# Patient Record
Sex: Female | Born: 1995 | Race: Black or African American | Hispanic: No | Marital: Single | Smoking: Never smoker
Health system: Southern US, Community
[De-identification: ages and names within clinical notes are randomized; demographics above are authoritative.]

## PROBLEM LIST (undated history)

## (undated) DIAGNOSIS — F32A Depression, unspecified: Secondary | ICD-10-CM

## (undated) DIAGNOSIS — F329 Major depressive disorder, single episode, unspecified: Secondary | ICD-10-CM

## (undated) HISTORY — PX: WISDOM TOOTH EXTRACTION: SHX21

## (undated) HISTORY — PX: CARDIAC SURGERY: SHX584

## (undated) HISTORY — DX: Major depressive disorder, single episode, unspecified: F32.9

## (undated) HISTORY — DX: Depression, unspecified: F32.A

---
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
## 2019-08-18 ENCOUNTER — Other Ambulatory Visit: Payer: Self-pay

## 2019-08-18 ENCOUNTER — Ambulatory Visit: Payer: Self-pay

## 2019-08-18 ENCOUNTER — Other Ambulatory Visit: Payer: Self-pay | Admitting: Family Medicine

## 2019-08-18 DIAGNOSIS — M25562 Pain in left knee: Secondary | ICD-10-CM

## 2019-12-09 ENCOUNTER — Ambulatory Visit (INDEPENDENT_AMBULATORY_CARE_PROVIDER_SITE_OTHER): Payer: Managed Care, Other (non HMO) | Admitting: Otolaryngology

## 2020-01-09 ENCOUNTER — Ambulatory Visit
Admission: EM | Admit: 2020-01-09 | Discharge: 2020-01-09 | Disposition: A | Discharge status: Home / Self Care | Payer: 59 | Attending: Physician Assistant | Admitting: Physician Assistant

## 2020-01-09 DIAGNOSIS — L500 Allergic urticaria: Secondary | ICD-10-CM

## 2020-01-09 MED ORDER — DEXAMETHASONE SODIUM PHOSPHATE 10 MG/ML IJ SOLN
10.0000 mg | Freq: Once | INTRAMUSCULAR | Status: AC
  Administered 2020-01-09: 10 mg via INTRAMUSCULAR

## 2020-01-09 NOTE — Discharge Instructions (Signed)
Decadron injection in office today. Get over the counter zyrtec 10mg  daily for the next 5-7 days. Monitor for any new changes in foods/contacts. If having trouble breathing, swelling to the throat, go to the emergency department for further evaluation.

## 2020-01-09 NOTE — ED Triage Notes (Signed)
Pt c/o hives to inner thighs, arms, chest, and neck since yesterday. Denies known allergies or new products.

## 2020-01-09 NOTE — ED Provider Notes (Signed)
[facility]    CSN: [id number] Arrival date & time: 01/09/20  1651      History   Chief Complaint Chief Complaint  Patient presents with  . Urticaria    HPI Karen Farmer is a 24 y.o. female.   24 year old female comes in for rash that started yesterday.  States this at first started to BUE, has now spread to the trunk, BLE. Itching in sensation. Denies obvious new contact, oral intake. Denies oral swelling, shortness of breath.      Past Medical History:  Diagnosis Date  . Depression     Patient Active Problem List   Diagnosis Date Noted  . Depression     Past Surgical History:  Procedure Laterality Date  . CARDIAC SURGERY     Age 59 years, stent placed  . WISDOM TOOTH EXTRACTION      OB History   No obstetric history on file.      Home Medications    Prior to Admission medications   Not on File    Family History Family History  Problem Relation Age of Onset  . Kidney disease Mother   . Hypertension Mother   . Hyperlipidemia Mother   . Asthma Mother   . Diabetes Mother   . Diabetes Father     Social History Social History   Tobacco Use  . Smoking status: Never Smoker  . Smokeless tobacco: Never Used  Substance Use Topics  . Alcohol use: No  . Drug use: No     Allergies   Penicillins   Review of Systems Review of Systems  Reason unable to perform ROS: See HPI as above.     Physical Exam Triage Vital Signs ED Triage Vitals  Enc Vitals Group     BP 01/09/20 1915 (!) 146/78     Pulse Rate 01/09/20 1915 66     Resp 01/09/20 1915 18     Temp 01/09/20 1915 98.1 F (36.7 C)     Temp Source 01/09/20 1915 Oral     SpO2 01/09/20 1915 98 %     Weight --      Height --      Head Circumference --      Peak Flow --      Pain Score 01/09/20 1916 0     Pain Loc --      Pain Edu? --      Excl. in GC? --    No data found.  Updated Vital Signs BP (!) 146/78 (BP Location: Left Arm)   Pulse 66   Temp 98.1 F  (36.7 C) (Oral)   Resp 18   LMP 01/02/2020   SpO2 98%   Physical Exam Constitutional:      General: She is not in acute distress.    Appearance: Normal appearance. She is well-developed. She is not toxic-appearing or diaphoretic.  HENT:     Head: Normocephalic and atraumatic.     Mouth/Throat:     Comments: Handling own secretions well. Eyes:     Conjunctiva/sclera: Conjunctivae normal.     Pupils: Pupils are equal, round, and reactive to light.  Pulmonary:     Effort: Pulmonary effort is normal. No respiratory distress.  Musculoskeletal:     Cervical back: Normal range of motion and neck supple.  Skin:    General: Skin is warm and dry.     Comments: Urticarial rash to BUE, trunk.   Neurological:     Mental Status: She is  alert and oriented to person, place, and time.      UC Treatments / Results  Labs (all labs ordered are listed, but only abnormal results are displayed) Labs Reviewed - No data to display  EKG   Radiology No results found.  Procedures Procedures (including critical care time)  Medications Ordered in UC Medications  dexamethasone (DECADRON) injection 10 mg (10 mg Intramuscular Given 01/09/20 1933)    Initial Impression / Assessment and Plan / UC Course  I have reviewed the triage vital signs and the nursing notes.  Pertinent labs & imaging results that were available during my care of the patient were reviewed by me and considered in my medical decision making (see chart for details).    Decadron injection in office today. Continue antihistamine. Continue to monitor for possible exposures. Return precautions given.  Final Clinical Impressions(s) / UC Diagnoses   Final diagnoses:  Allergic urticaria   ED Prescriptions    None     PDMP not reviewed this encounter.   Belinda Fisher, PA-C 01/09/20 [phone]

## 2020-05-06 ENCOUNTER — Emergency Department (HOSPITAL_BASED_OUTPATIENT_CLINIC_OR_DEPARTMENT_OTHER): Payer: 59

## 2020-05-06 ENCOUNTER — Emergency Department (HOSPITAL_BASED_OUTPATIENT_CLINIC_OR_DEPARTMENT_OTHER)
Admission: EM | Admit: 2020-05-06 | Discharge: 2020-05-06 | Disposition: A | Discharge status: Home / Self Care | Payer: 59 | Attending: Emergency Medicine | Admitting: Emergency Medicine

## 2020-05-06 ENCOUNTER — Other Ambulatory Visit: Payer: Self-pay

## 2020-05-06 DIAGNOSIS — U071 COVID-19: Secondary | ICD-10-CM | POA: Insufficient documentation

## 2020-05-06 DIAGNOSIS — Z955 Presence of coronary angioplasty implant and graft: Secondary | ICD-10-CM | POA: Diagnosis not present

## 2020-05-06 DIAGNOSIS — R0602 Shortness of breath: Secondary | ICD-10-CM | POA: Diagnosis present

## 2020-05-06 NOTE — ED Triage Notes (Signed)
Pt. Tested positive for Covid on Thurs 17th .  Pt. Said she was feeling short of breath worse today.  Sat in triage is 100% with no SHOB  Noted.  Pt. Is clearing her throat in triage constantly.

## 2020-05-06 NOTE — ED Provider Notes (Signed)
[facility] EMERGENCY DEPARTMENT Provider Note   CSN: [id number] Arrival date & time: 05/06/20  1932     History Chief Complaint  Patient presents with  . Shortness of Breath    Karen Farmer is a 25 y.o. female.  The history is provided by the patient.  Shortness of Breath Severity:  Moderate Onset quality:  Gradual Duration:  2 days Timing:  Intermittent Progression:  Worsening Chronicity:  New Context comment:  Diagnosed with COVID 9 days ago Relieved by:  Rest and sitting up Worsened by:  Activity (laying down) Ineffective treatments:  None tried Associated symptoms: cough and headaches   Associated symptoms: no abdominal pain, no chest pain, no fever, no sputum production and no wheezing   Risk factors comment:  Healthy      Past Medical History:  Diagnosis Date  . Depression     Patient Active Problem List   Diagnosis Date Noted  . Depression     Past Surgical History:  Procedure Laterality Date  . CARDIAC SURGERY     Age 25 years, stent placed  . WISDOM TOOTH EXTRACTION       OB History   No obstetric history on file.     Family History  Problem Relation Age of Onset  . Kidney disease Mother   . Hypertension Mother   . Hyperlipidemia Mother   . Asthma Mother   . Diabetes Mother   . Diabetes Father     Social History   Tobacco Use  . Smoking status: Never Smoker  . Smokeless tobacco: Never Used  Substance Use Topics  . Alcohol use: No  . Drug use: No    Home Medications Prior to Admission medications   Not on File    Allergies    Penicillins  Review of Systems   Review of Systems  Constitutional: Negative for fever.  Respiratory: Positive for cough and shortness of breath. Negative for sputum production and wheezing.   Cardiovascular: Negative for chest pain.  Gastrointestinal: Negative for abdominal pain.  Neurological: Positive for headaches.  All other systems reviewed and are negative.   Physical  Exam Updated Vital Signs BP 120/80 (BP Location: Right Arm)   Pulse 88   Temp 98.5 F (36.9 C) (Oral)   Resp 18   Ht 5\' 3"  (1.6 m)   Wt 81.2 kg   LMP 04/12/2020   SpO2 100%   BMI 31.71 kg/m   Physical Exam Vitals and nursing note reviewed.  Constitutional:      General: She is not in acute distress.    Appearance: She is well-developed and well-nourished.  HENT:     Head: Normocephalic and atraumatic.  Eyes:     Extraocular Movements: EOM normal.     Pupils: Pupils are equal, round, and reactive to light.  Cardiovascular:     Rate and Rhythm: Normal rate and regular rhythm.     Pulses: Intact distal pulses.     Heart sounds: Normal heart sounds. No murmur heard. No friction rub.  Pulmonary:     Effort: Pulmonary effort is normal. No tachypnea or accessory muscle usage.     Breath sounds: Normal breath sounds. No decreased breath sounds, wheezing or rales.  Musculoskeletal:        General: No tenderness. Normal range of motion.     Right lower leg: No edema.     Left lower leg: No edema.     Comments: No edema  Skin:    General:  Skin is warm and dry.     Findings: No rash.  Neurological:     General: No focal deficit present.     Mental Status: She is alert and oriented to person, place, and time. Mental status is at baseline.     Cranial Nerves: No cranial nerve deficit.  Psychiatric:        Mood and Affect: Mood and affect and mood normal.        Behavior: Behavior normal.     ED Results / Procedures / Treatments   Labs (all labs ordered are listed, but only abnormal results are displayed) Labs Reviewed - No data to display  EKG None  Radiology DG [facility] 1 View  Result Date: 05/06/2020 CLINICAL DATA:  Shortness of breath.  Body aches. EXAM: PORTABLE CHEST 1 VIEW COMPARISON:  None. FINDINGS: The heart size and mediastinal contours are within normal limits. Both lungs are clear. The visualized skeletal structures are unremarkable. IMPRESSION: No active  disease. Electronically Signed   By: Gerome Sam III M.D   On: 05/06/2020 20:23    Procedures Procedures   Medications Ordered in ED Medications - No data to display  ED Course  I have reviewed the triage vital signs and the nursing notes.  Pertinent labs & imaging results that were available during my care of the patient were reviewed by me and considered in my medical decision making (see chart for details).    MDM Rules/Calculators/A&P                          Pt with symptoms consistent with COVID.  Well appearing here.  No signs of breathing difficulty and sats 100% on RA.  Pt is vaccinated and tested positive about 8-9 days ago.  She was doing ok until yesterday when she started having SOB when lying down and extreme fatigue and SOB with activity.  No signs of pharyngitis, otitis or abnormal abdominal findings.   CXR pending.  8:29 PM CXR wnl.  Low suspicion for PE at this time as patient has no tachycardia or chest pain.  Normal sats and suspect normal progression of covid.  Will d/c home to continue supportive care.  TRELLIS GUIRGUIS was evaluated in Emergency Department on 05/06/2020 for the symptoms described in the history of present illness. She was evaluated in the context of the global COVID-19 pandemic, which necessitated consideration that the patient might be at risk for infection with the SARS-CoV-2 virus that causes COVID-19. Institutional protocols and algorithms that pertain to the evaluation of patients at risk for COVID-19 are in a state of rapid change based on information released by regulatory bodies including the CDC and federal and state organizations. These policies and algorithms were followed during the patient's care in the ED.   Final Clinical Impression(s) / ED Diagnoses Final diagnoses:  COVID    Rx / DC Orders ED Discharge Orders    None       Gwyneth Sprout, MD 05/06/20 2038

## 2020-05-06 NOTE — ED Notes (Signed)
EDP at bedside  

## 2020-05-14 ENCOUNTER — Other Ambulatory Visit: Payer: Self-pay

## 2020-05-14 ENCOUNTER — Ambulatory Visit
Admission: EM | Admit: 2020-05-14 | Discharge: 2020-05-14 | Disposition: A | Discharge status: Home / Self Care | Payer: 59 | Attending: Emergency Medicine | Admitting: Emergency Medicine

## 2020-05-14 ENCOUNTER — Encounter: Payer: Self-pay | Admitting: *Deleted

## 2020-05-14 DIAGNOSIS — M546 Pain in thoracic spine: Secondary | ICD-10-CM | POA: Diagnosis not present

## 2020-05-14 DIAGNOSIS — S8002XA Contusion of left knee, initial encounter: Secondary | ICD-10-CM

## 2020-05-14 MED ORDER — IBUPROFEN 800 MG PO TABS: 800.0000 mg | ORAL_TABLET | Freq: Three times a day (TID) | ORAL | 0 refills | Status: AC

## 2020-05-14 MED ORDER — CYCLOBENZAPRINE HCL 5 MG PO TABS: 5.0000 mg | ORAL_TABLET | Freq: Two times a day (BID) | ORAL | 0 refills | Status: AC | PRN
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
## 2020-05-14 NOTE — Discharge Instructions (Signed)
Use anti-inflammatories for pain/swelling. You may take up to 800 mg Ibuprofen every 8 hours with food. You may supplement Ibuprofen with Tylenol [phone] mg every 8 hours.   You may use flexeril as needed to help with pain. This is a muscle relaxer and causes sedation- please use only at bedtime or when you will be home and not have to drive/work  Ice knee, alternate ice and heat to back  Follow up if not improving or worsening
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
## 2020-09-19 ENCOUNTER — Encounter: Payer: Self-pay | Admitting: Emergency Medicine

## 2020-09-19 ENCOUNTER — Other Ambulatory Visit: Payer: Self-pay

## 2020-09-19 ENCOUNTER — Ambulatory Visit
Admission: EM | Admit: 2020-09-19 | Discharge: 2020-09-19 | Disposition: A | Discharge status: Home / Self Care | Payer: 59 | Attending: Family Medicine | Admitting: Family Medicine

## 2020-09-19 DIAGNOSIS — Z113 Encounter for screening for infections with a predominantly sexual mode of transmission: Secondary | ICD-10-CM | POA: Diagnosis not present

## 2020-09-19 DIAGNOSIS — Z880 Allergy status to penicillin: Secondary | ICD-10-CM | POA: Diagnosis not present

## 2020-09-19 DIAGNOSIS — N760 Acute vaginitis: Secondary | ICD-10-CM | POA: Insufficient documentation

## 2020-09-19 DIAGNOSIS — N898 Other specified noninflammatory disorders of vagina: Secondary | ICD-10-CM | POA: Diagnosis present

## 2020-09-19 MED ORDER — FLUCONAZOLE 150 MG PO TABS: 150.0000 mg | ORAL_TABLET | ORAL | 0 refills | Status: AC

## 2020-09-19 NOTE — ED Provider Notes (Signed)
[facility]    CSN: [id number] Arrival date & time: 09/19/20  1359      History   Chief Complaint Chief Complaint  Patient presents with   Vaginal Discharge    HPI Karen Farmer is a 25 y.o. female.   Patient presenting today with 2 to 3-day history of vaginal discharge that she states is clumpy and white, vaginal itching and irritation.  She denies any new sexual partners, new soaps or washes, medication changes.  Has not tried anything over-the-counter for symptoms.  No past history that she is aware of of vaginal infections.  Started her menstrual cycle yesterday.   Past Medical History:  Diagnosis Date   Depression     Patient Active Problem List   Diagnosis Date Noted   Depression     Past Surgical History:  Procedure Laterality Date   CARDIAC SURGERY     Age 32 years, stent placed   WISDOM TOOTH EXTRACTION      OB History   No obstetric history on file.      Home Medications    Prior to Admission medications   Medication Sig Start Date End Date Taking? Authorizing Provider  fluconazole (DIFLUCAN) 150 MG tablet Take 1 tablet (150 mg total) by mouth every other day. 09/19/20  Yes Particia Nearing, PA-C  cyclobenzaprine (FLEXERIL) 5 MG tablet Take 1-2 tablets (5-10 mg total) by mouth 2 (two) times daily as needed for muscle spasms. 05/14/20   Wieters, Hallie C, PA-C  ibuprofen (ADVIL) 800 MG tablet Take 1 tablet (800 mg total) by mouth 3 (three) times daily. 05/14/20   Wieters, Junius Creamer, PA-C    Family History Family History  Problem Relation Age of Onset   Kidney disease Mother    Hypertension Mother    Hyperlipidemia Mother    Asthma Mother    Diabetes Mother    Diabetes Father     Social History Social History   Tobacco Use   Smoking status: Never   Smokeless tobacco: Never  Substance Use Topics   Alcohol use: No   Drug use: No     Allergies   Penicillins   Review of Systems Review of Systems Per HPI  Physical  Exam Triage Vital Signs ED Triage Vitals  Enc Vitals Group     BP 09/19/20 1445 108/73     Pulse Rate 09/19/20 1445 71     Resp 09/19/20 1445 13     Temp 09/19/20 1445 98.7 F (37.1 C)     Temp src --      SpO2 09/19/20 1445 98 %     Weight --      Height --      Head Circumference --      Peak Flow --      Pain Score 09/19/20 1444 0     Pain Loc --      Pain Edu? --      Excl. in GC? --    No data found.  Updated Vital Signs BP 108/73 (BP Location: Left Arm)   Pulse 71   Temp 98.7 F (37.1 C)   Resp 13   LMP 09/18/2020 (Exact Date)   SpO2 98%   Visual Acuity Right Eye Distance:   Left Eye Distance:   Bilateral Distance:    Right Eye Near:   Left Eye Near:    Bilateral Near:     Physical Exam Vitals and nursing note reviewed.  Constitutional:  Appearance: Normal appearance. She is not ill-appearing.  HENT:     Head: Atraumatic.  Eyes:     Extraocular Movements: Extraocular movements intact.     Conjunctiva/sclera: Conjunctivae normal.  Cardiovascular:     Rate and Rhythm: Normal rate and regular rhythm.     Heart sounds: Normal heart sounds.  Pulmonary:     Effort: Pulmonary effort is normal.     Breath sounds: Normal breath sounds.  Abdominal:     General: Bowel sounds are normal. There is no distension.     Palpations: Abdomen is soft.     Tenderness: There is no abdominal tenderness. There is no guarding.  Genitourinary:    Comments: GU exam deferred, self swab performed Musculoskeletal:        General: Normal range of motion.     Cervical back: Normal range of motion and neck supple.  Skin:    General: Skin is warm and dry.  Neurological:     Mental Status: She is alert and oriented to person, place, and time.  Psychiatric:        Mood and Affect: Mood normal.        Thought Content: Thought content normal.        Judgment: Judgment normal.     UC Treatments / Results  Labs (all labs ordered are listed, but only abnormal results are  displayed) Labs Reviewed  CERVICOVAGINAL ANCILLARY ONLY    EKG   Radiology No results found.  Procedures Procedures (including critical care time)  Medications Ordered in UC Medications - No data to display  Initial Impression / Assessment and Plan / UC Course  I have reviewed the triage vital signs and the nursing notes.  Pertinent labs & imaging results that were available during my care of the patient were reviewed by me and considered in my medical decision making (see chart for details).     Given symptoms, suspect vaginal yeast infection but awaiting vaginal swab for further evaluation.  In the meantime, will treat with Diflucan tab, probiotics, unscented soaps and avoiding any sort of feminine washes.  Adjust as needed based on results.  Final Clinical Impressions(s) / UC Diagnoses   Final diagnoses:  Acute vaginitis   Discharge Instructions   None    ED Prescriptions     Medication Sig Dispense Auth. Provider   fluconazole (DIFLUCAN) 150 MG tablet Take 1 tablet (150 mg total) by mouth every other day. 2 tablet Particia Nearing, [place]      PDMP not reviewed this encounter.   Particia Nearing, [place] 09/19/20 [phone]

## 2020-09-19 NOTE — ED Triage Notes (Signed)
Patient c/o vaginal discharge x 2 days.   Patient states " my discharge looks like cottage cheese and has an odor".   Patient endorses vaginal irritation and itching.   Patient denies ABD pain, dysuria, and N/V/D.   Patient has tried cold compress with no relief of symptoms.

## 2020-09-20 ENCOUNTER — Telehealth (HOSPITAL_COMMUNITY): Payer: Self-pay | Admitting: Emergency Medicine

## 2020-09-20 LAB — CERVICOVAGINAL ANCILLARY ONLY
Bacterial Vaginitis (gardnerella): POSITIVE — AB
Candida Glabrata: NEGATIVE
Candida Vaginitis: POSITIVE — AB
Chlamydia: POSITIVE — AB
Comment: NEGATIVE
Comment: NEGATIVE
Comment: NEGATIVE
Comment: NEGATIVE
Comment: NEGATIVE
Comment: NORMAL
Neisseria Gonorrhea: POSITIVE — AB
Trichomonas: NEGATIVE

## 2020-09-20 MED ORDER — DOXYCYCLINE HYCLATE 100 MG PO CAPS: 100.0000 mg | ORAL_CAPSULE | Freq: Two times a day (BID) | ORAL | 0 refills | Status: AC

## 2020-09-20 MED ORDER — METRONIDAZOLE 0.75 % VA GEL: 1.0000 | Freq: Every day | VAGINAL | 0 refills | Status: AC

## 2020-09-20 NOTE — Telephone Encounter (Signed)
Per protocol, patient will need treatment with IM Rocephin 500mg  for positive gonorrhea.  All other results and treatment reviewed. Contacted patient by phone.  Verified identity using two identifiers.  Provided positive result.  Reviewed safe sex practices, notifying partners, and refraining from sexual activities for 7 days from time of treatment.  Patient verified understanding, all questions answered.

## 2020-09-21 ENCOUNTER — Other Ambulatory Visit: Payer: Self-pay

## 2020-09-21 ENCOUNTER — Ambulatory Visit
Admission: EM | Admit: 2020-09-21 | Discharge: 2020-09-21 | Disposition: A | Discharge status: Home / Self Care | Payer: 59 | Attending: Emergency Medicine | Admitting: Emergency Medicine

## 2020-09-21 DIAGNOSIS — A549 Gonococcal infection, unspecified: Secondary | ICD-10-CM

## 2020-09-21 MED ORDER — CEFTRIAXONE SODIUM 500 MG IJ SOLR
500.0000 mg | Freq: Once | INTRAMUSCULAR | Status: AC
Start: 2020-09-21 — End: 2020-09-21
  Administered 2020-09-21: 500 mg via INTRAMUSCULAR

## 2020-09-21 NOTE — ED Triage Notes (Signed)
Pt presents for STD tx. Per provider's request verified patient's quality of allergic reaction to penicillin. Pt confirms previous rash with penicillin. Denies sob.

## 2021-11-05 IMAGING — DX DG KNEE COMPLETE 4+V*L*
4 series · 4 of 4 positions shown · non-contrast
Comparison: None.

CLINICAL DATA: Pain after a fall.

EXAM:
LEFT KNEE - COMPLETE 4+ VIEW

[knee pa]
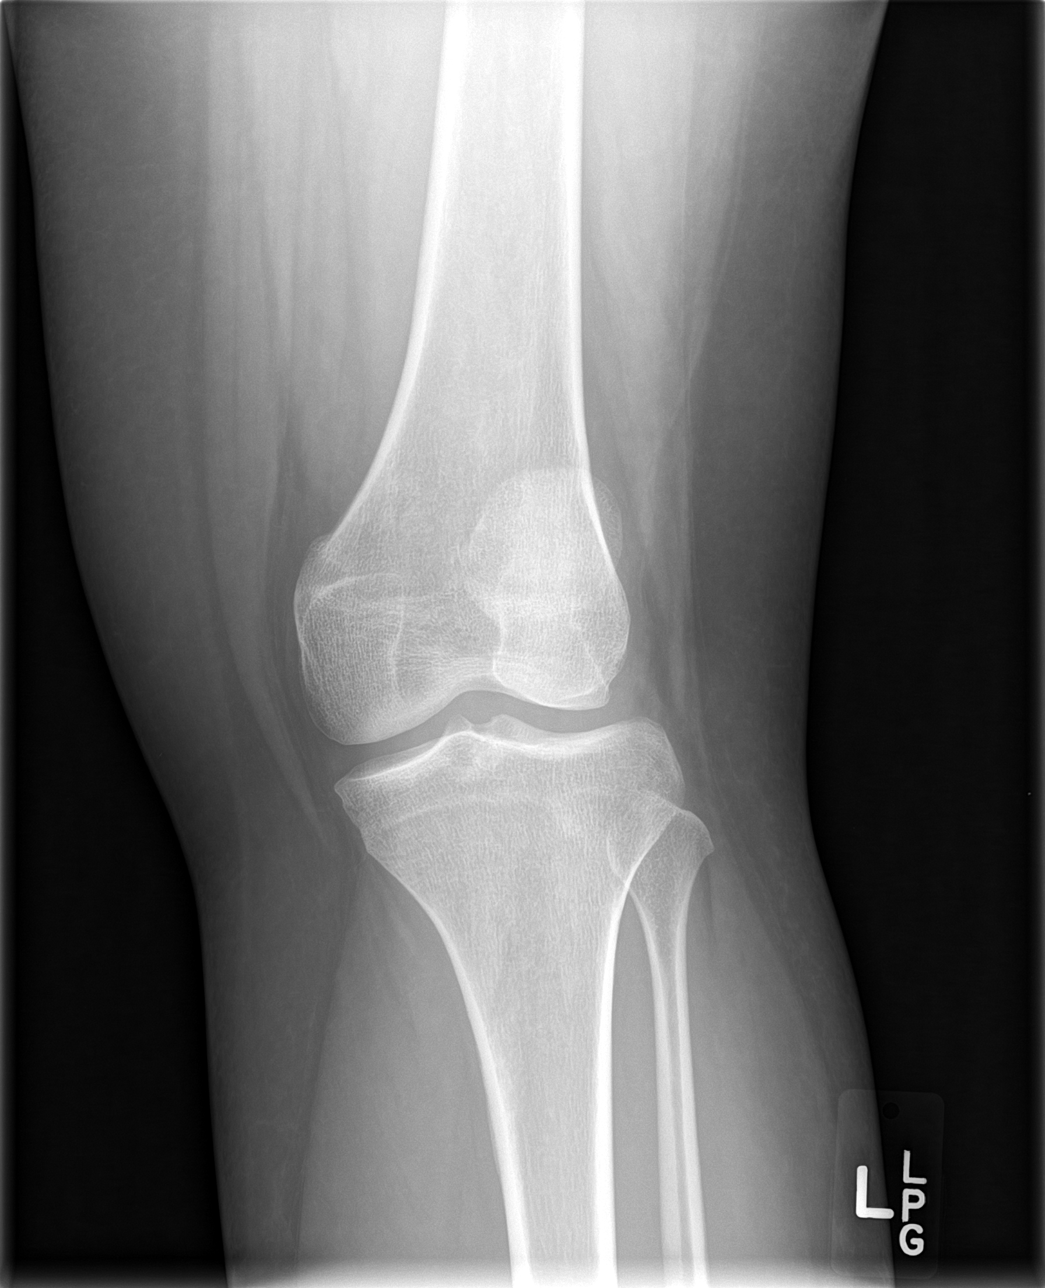

[knee obl (1 of 2)]
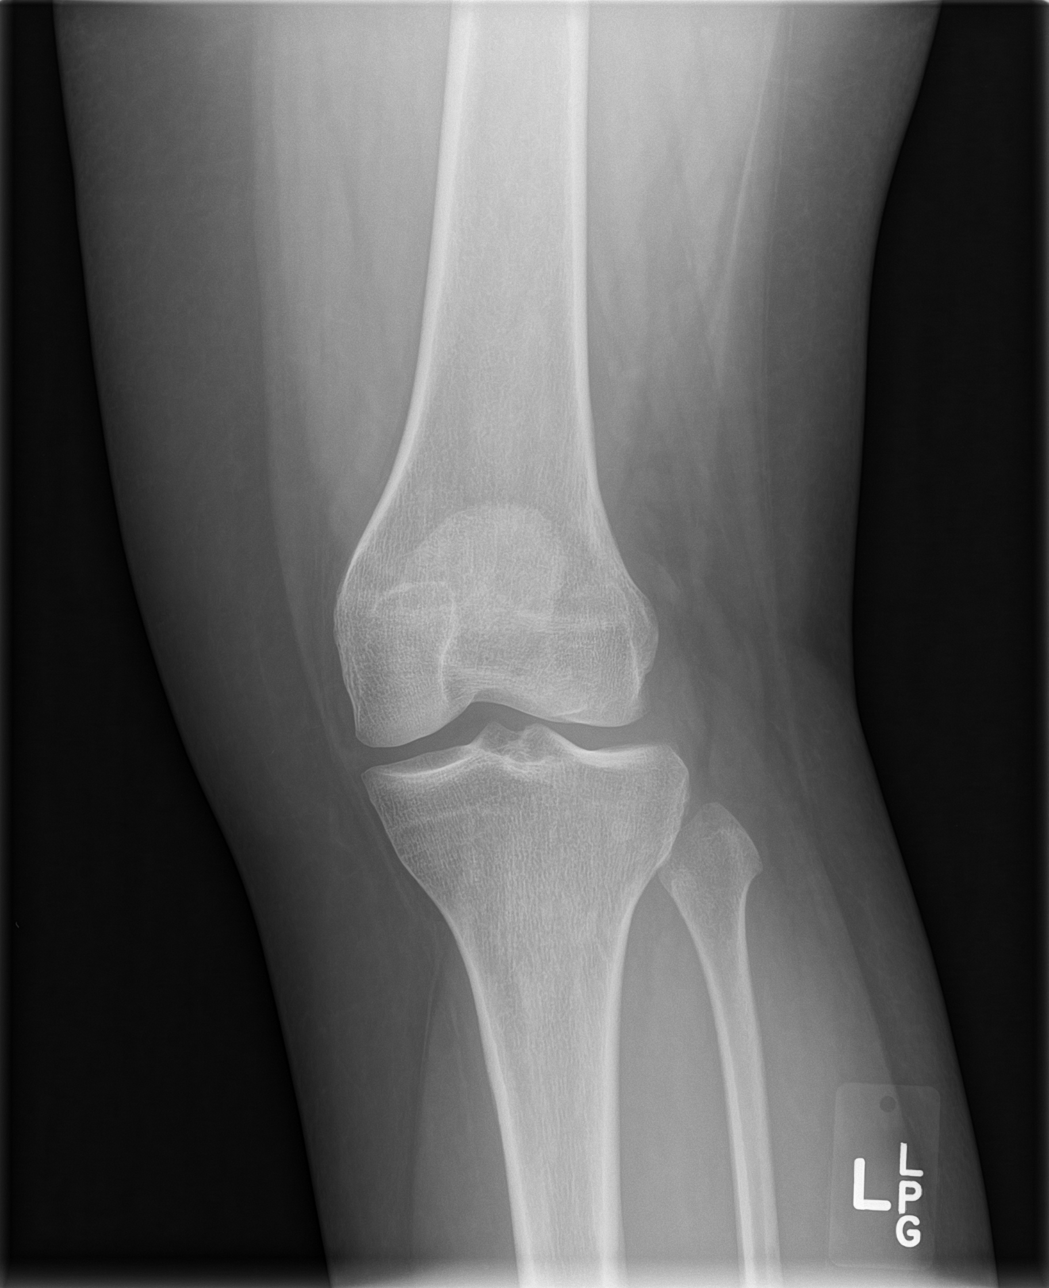

[knee obl (2 of 2)]
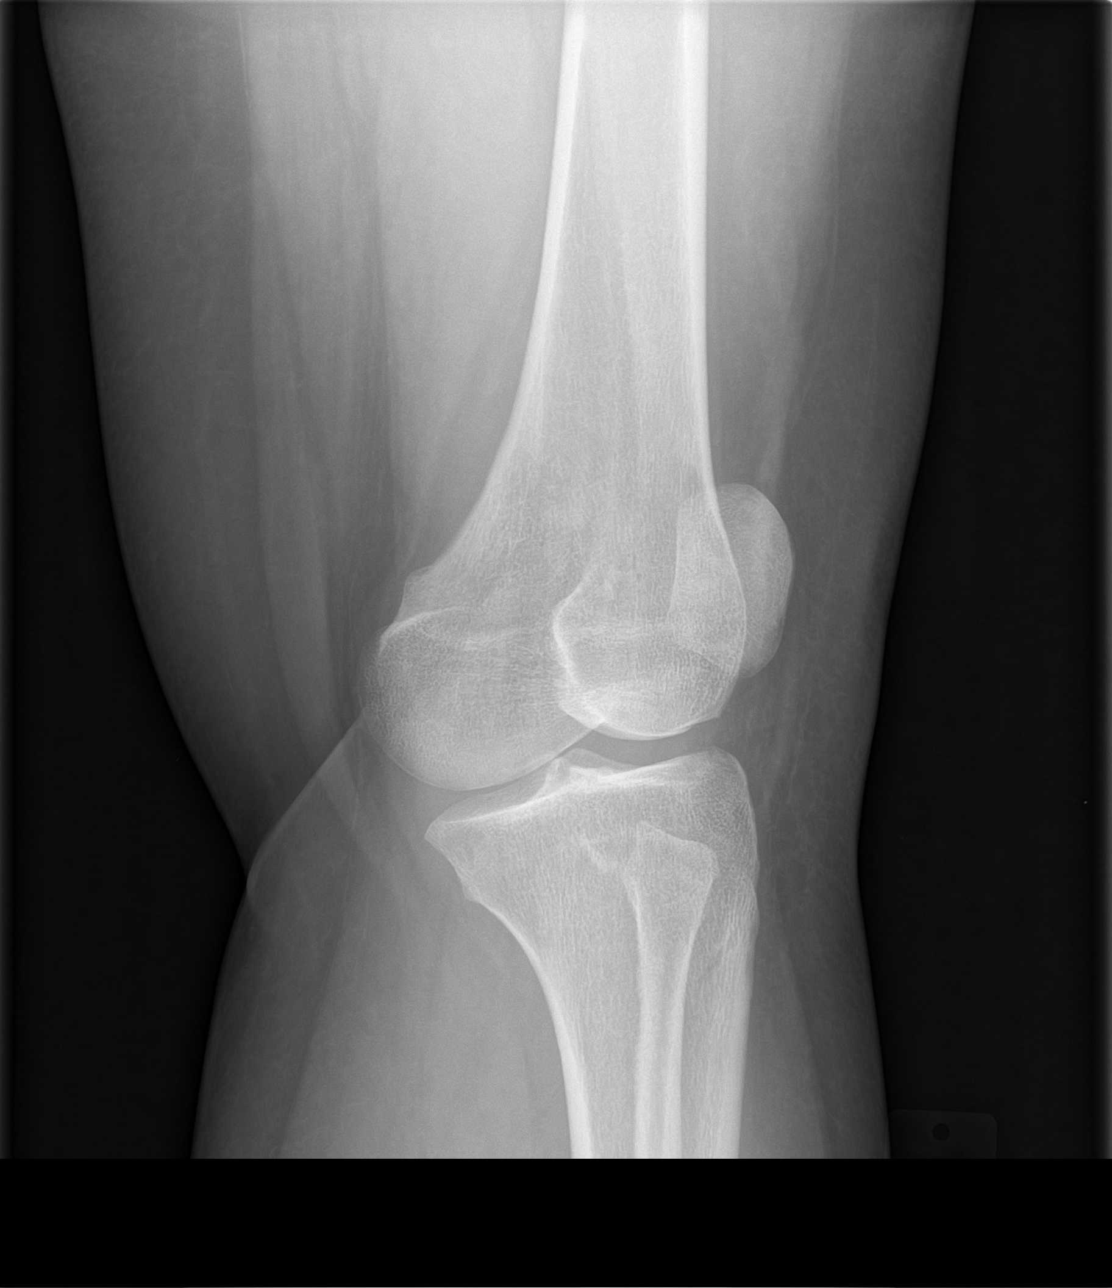

[knee lat]
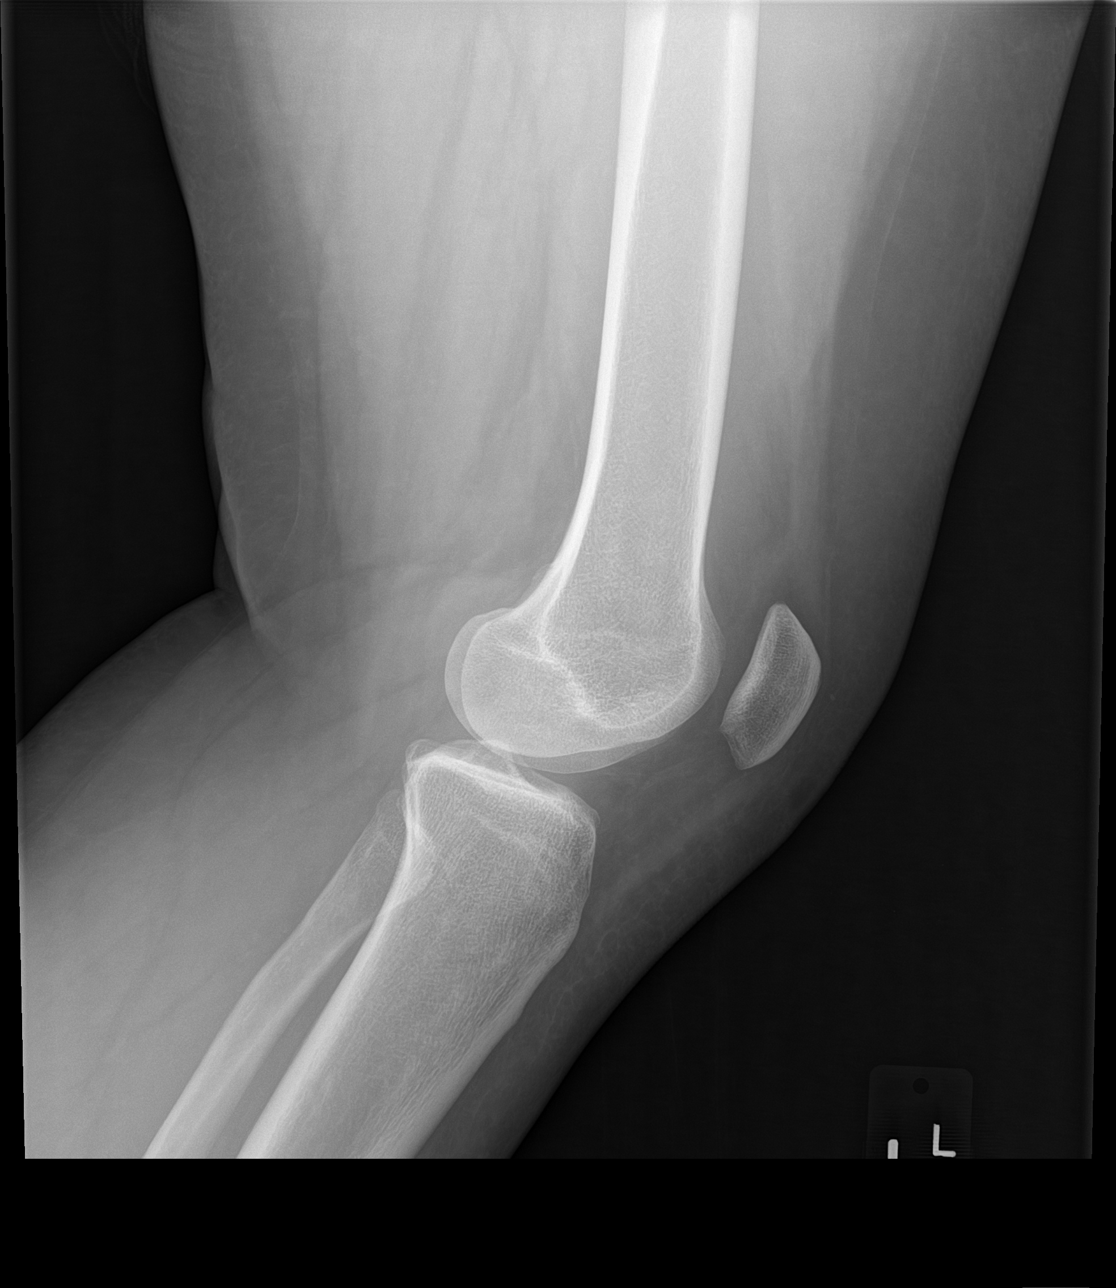

[4 of 4 positions shown; findings below may reference images not displayed]

FINDINGS: No acute fracture or dislocation. Joint spaces maintained. No joint
effusion.
IMPRESSION: No acute osseous abnormality.

## 2021-11-09 ENCOUNTER — Other Ambulatory Visit: Payer: Self-pay

## 2021-11-09 ENCOUNTER — Ambulatory Visit (INDEPENDENT_AMBULATORY_CARE_PROVIDER_SITE_OTHER): Payer: 59

## 2021-11-09 ENCOUNTER — Encounter (HOSPITAL_COMMUNITY): Payer: Self-pay | Admitting: Emergency Medicine

## 2021-11-09 ENCOUNTER — Ambulatory Visit (HOSPITAL_COMMUNITY)
Admission: EM | Admit: 2021-11-09 | Discharge: 2021-11-09 | Disposition: A | Discharge status: Home / Self Care | Payer: 59

## 2021-11-09 DIAGNOSIS — M79661 Pain in right lower leg: Secondary | ICD-10-CM

## 2021-11-09 DIAGNOSIS — S80811A Abrasion, right lower leg, initial encounter: Secondary | ICD-10-CM | POA: Diagnosis not present

## 2021-11-09 DIAGNOSIS — M79604 Pain in right leg: Secondary | ICD-10-CM

## 2021-11-09 MED ORDER — IBUPROFEN 800 MG PO TABS
800.0000 mg | ORAL_TABLET | Freq: Once | ORAL | Status: AC
  Administered 2021-11-09: 800 mg via ORAL

## 2021-11-09 MED ORDER — IBUPROFEN 800 MG PO TABS
ORAL_TABLET | ORAL | Status: AC
  Filled 2021-11-09: qty 1
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
## 2021-11-09 NOTE — Discharge Instructions (Addendum)
I recommend using ibuprofen every 6 hours for pain. You can also alternate with tylenol. Try ice to the areas of pain/swelling, elevate the leg. You can apply antibiotic ointment twice daily to the cuts on your leg.  If you have any worsening symptoms including severe headache, loss of consciousness, vomiting, weakness or loss of sensation in the extremities, etc, please go right to the emergency department.
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
## 2021-11-15 ENCOUNTER — Encounter: Payer: Self-pay | Admitting: Sports Medicine

## 2021-11-15 ENCOUNTER — Ambulatory Visit (INDEPENDENT_AMBULATORY_CARE_PROVIDER_SITE_OTHER): Payer: 59 | Admitting: Sports Medicine

## 2021-11-15 DIAGNOSIS — T300 Burn of unspecified body region, unspecified degree: Secondary | ICD-10-CM | POA: Diagnosis not present
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
## 2022-07-25 IMAGING — DX DG CHEST 1V PORT
1 series · 1 of 1 positions shown · non-contrast
Comparison: None.

CLINICAL DATA: Shortness of breath.  Body aches.

EXAM:
PORTABLE CHEST 1 VIEW

[chest ap]
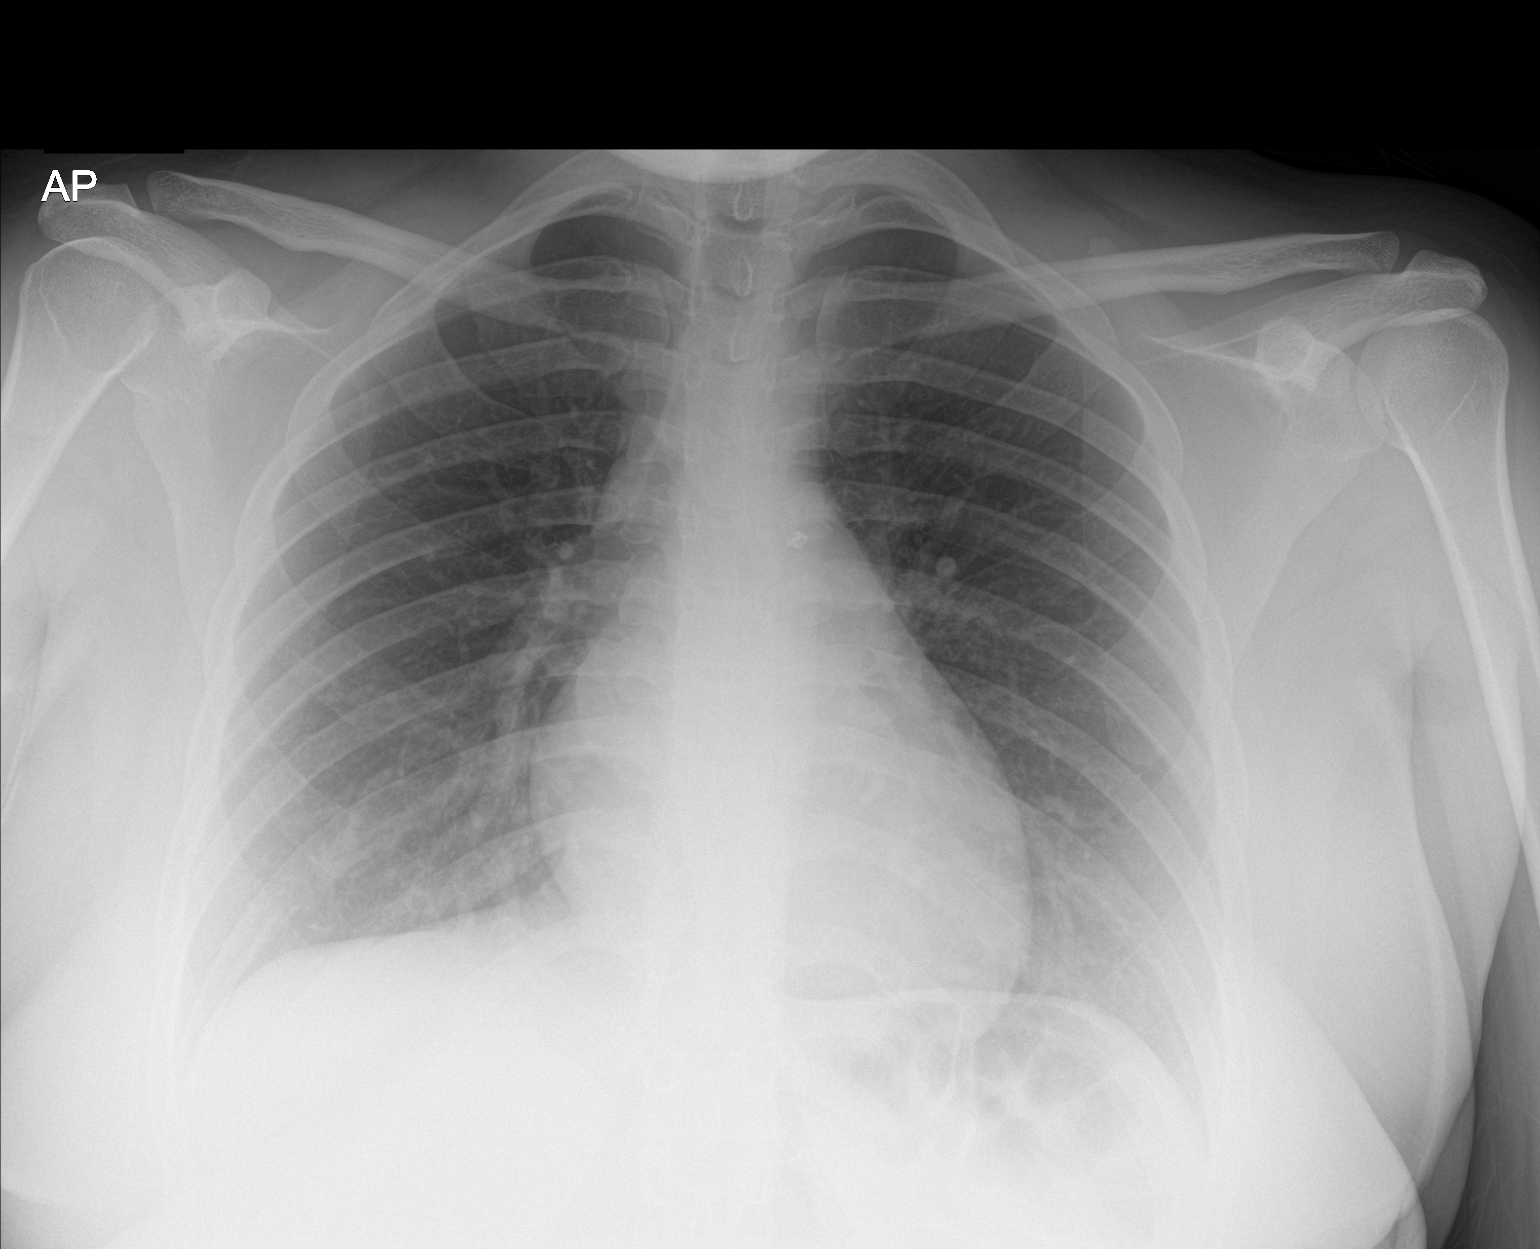

[1 of 1 positions shown; findings below may reference images not displayed]

FINDINGS: The heart size and mediastinal contours are within normal limits.
Both lungs are clear. The visualized skeletal structures are
unremarkable.
IMPRESSION: No active disease.

## 2023-04-08 ENCOUNTER — Ambulatory Visit
Admission: RE | Admit: 2023-04-08 | Discharge: 2023-04-08 | Disposition: A | Discharge status: Home / Self Care | Payer: 59 | Source: Ambulatory Visit | Attending: Internal Medicine | Admitting: Internal Medicine

## 2023-04-08 VITALS — BP 121/72 | HR 86 | Temp 98.8°F | Resp 16

## 2023-04-08 DIAGNOSIS — E119 Type 2 diabetes mellitus without complications: Secondary | ICD-10-CM | POA: Diagnosis not present

## 2023-04-08 DIAGNOSIS — N898 Other specified noninflammatory disorders of vagina: Secondary | ICD-10-CM | POA: Insufficient documentation

## 2023-04-08 DIAGNOSIS — R300 Dysuria: Secondary | ICD-10-CM | POA: Diagnosis not present

## 2023-04-08 DIAGNOSIS — Z113 Encounter for screening for infections with a predominantly sexual mode of transmission: Secondary | ICD-10-CM | POA: Diagnosis not present

## 2023-04-08 DIAGNOSIS — J101 Influenza due to other identified influenza virus with other respiratory manifestations: Secondary | ICD-10-CM | POA: Insufficient documentation

## 2023-04-08 LAB — POCT URINALYSIS DIP (MANUAL ENTRY)
Bilirubin, UA: NEGATIVE
Glucose, UA: NEGATIVE mg/dL
Nitrite, UA: NEGATIVE
Protein Ur, POC: 30 mg/dL — AB
Spec Grav, UA: >=1.03 — AB (ref 1.010–1.025)
Urobilinogen, UA: 0.2 U/dL
pH, UA: 6 (ref 5.0–8.0)

## 2023-04-08 LAB — POCT INFLUENZA A/B
Influenza A, POC: POSITIVE — AB
Influenza B, POC: NEGATIVE

## 2023-04-08 MED ORDER — OSELTAMIVIR PHOSPHATE 75 MG PO CAPS: 75.0000 mg | ORAL_CAPSULE | Freq: Two times a day (BID) | ORAL | 0 refills | Status: AC

## 2023-04-08 MED ORDER — PROMETHAZINE-DM 6.25-15 MG/5ML PO SYRP: 5.0000 mL | ORAL_SOLUTION | Freq: Every evening | ORAL | 0 refills | Status: AC | PRN

## 2023-04-08 NOTE — ED Provider Notes (Signed)
[facility]    CSN: [id number] Arrival date & time: 04/08/23  1032      History   Chief Complaint Chief Complaint  Patient presents with   Cough    Entered by patient    HPI Karen Farmer is a 28 y.o. female.   [name] is a 28 y.o. female presenting for chief complaint of cough, nasal congestion, sore throat, and generalized fatigue that started 2 days ago on Monday April 06, 2023. Cough is productive. Max temp at home 102 yesterday. Coworkers have been sick with similar symptoms. Denies N/V/D, abdominal pain, CP, SOB, rash, and dizziness. Never smoker, no history of chronic respiratory problems. Taking OTC cough and cold medicine with some relief.   Additionally reports white thin vaginal discharge and vaginal itching started a few days ago. Denies vaginal odor. She was treated for BV with Flagyl Gel intravaginally for 5 days while she was on her menstrual cycle and she states she "feels like it didn't work". Also reports urinary frequency and dysuria started a few days ago without any urinary urgency, flank pain, gross hematuria, dizziness, low back pain, abdominal pain, or headaches. She denies recent oral antibiotic use. STD testing at last visit was negative and she has not had any new partners since she was last tested for STDs.    Cough   Past Medical History:  Diagnosis Date   Depression     Patient Active Problem List   Diagnosis Date Noted   Depression     Past Surgical History:  Procedure Laterality Date   CARDIAC SURGERY     Age 18 years, stent placed   WISDOM TOOTH EXTRACTION      OB History   No obstetric history on file.      Home Medications    Prior to Admission medications   Medication Sig Start Date End Date Taking? Authorizing Provider  oseltamivir (TAMIFLU) 75 MG capsule Take 1 capsule (75 mg total) by mouth every 12 (twelve) hours. 04/08/23  Yes Carlisle Beers, FNP  promethazine-dextromethorphan  (PROMETHAZINE-DM) 6.25-15 MG/5ML syrup Take 5 mLs by mouth at bedtime as needed for cough. 04/08/23  Yes Carlisle Beers, FNP  azithromycin (ZITHROMAX) 250 MG tablet 2 tablet  on the first day, then 1 tablet daily for 4 days 02/10/18   [provider]  cyclobenzaprine (FLEXERIL) 5 MG tablet Take 1-2 tablets (5-10 mg total) by mouth 2 (two) times daily as needed for muscle spasms. Patient not taking: Reported on 11/09/2021 05/14/20   Wieters, Fran Lowes C, PA-C  fluconazole (DIFLUCAN) 150 MG tablet Take 1 tablet (150 mg total) by mouth every other day. Patient not taking: Reported on 11/09/2021 09/19/20   Particia Nearing, PA-C  ibuprofen (ADVIL) 800 MG tablet Take 1 tablet (800 mg total) by mouth 3 (three) times daily. 05/14/20   Wieters, Hallie C, PA-C  OFLOXACIN OT Place in ear(s).    [provider]    Family History Family History  Problem Relation Age of Onset   Kidney disease Mother    Hypertension Mother    Hyperlipidemia Mother    Asthma Mother    Diabetes Mother    Diabetes Father     Social History Social History   Tobacco Use   Smoking status: Never   Smokeless tobacco: Never  Vaping Use   Vaping status: Never Used  Substance Use Topics   Alcohol use: No   Drug use: No     Allergies  Penicillins   Review of Systems Review of Systems  Respiratory:  Positive for cough.   Per HPI   Physical Exam Triage Vital Signs ED Triage Vitals [04/08/23 1040]  Encounter Vitals Group     BP 121/72     Systolic BP Percentile      Diastolic BP Percentile      Pulse Rate 86     Resp 16     Temp 98.8 F (37.1 C)     Temp Source Oral     SpO2 98 %     Weight      Height      Head Circumference      Peak Flow      Pain Score 0     Pain Loc      Pain Education      Exclude from Growth Chart    No data found.  Updated Vital Signs BP 121/72 (BP Location: Right Arm)   Pulse 86   Temp 98.8 F (37.1 C) (Oral)   Resp 16   LMP 03/31/2023  (Approximate)   SpO2 98%   Visual Acuity Right Eye Distance:   Left Eye Distance:   Bilateral Distance:    Right Eye Near:   Left Eye Near:    Bilateral Near:     Physical Exam Vitals and nursing note reviewed.  Constitutional:      Appearance: She is not ill-appearing or toxic-appearing.  HENT:     Head: Normocephalic and atraumatic.     Right Ear: Hearing, tympanic membrane, ear canal and external ear normal.     Left Ear: Hearing, tympanic membrane, ear canal and external ear normal.     Nose: Congestion present.     Mouth/Throat:     Lips: Pink.     Mouth: Mucous membranes are moist. No injury or oral lesions.     Dentition: Normal dentition.     Tongue: No lesions.     Pharynx: Oropharynx is clear. Uvula midline. No pharyngeal swelling, oropharyngeal exudate, posterior oropharyngeal erythema, uvula swelling or postnasal drip.     Tonsils: No tonsillar exudate.  Eyes:     General: Lids are normal. Vision grossly intact. Gaze aligned appropriately.     Extraocular Movements: Extraocular movements intact.     Conjunctiva/sclera: Conjunctivae normal.  Neck:     Trachea: Trachea and phonation normal.  Cardiovascular:     Rate and Rhythm: Normal rate and regular rhythm.     Heart sounds: Normal heart sounds, S1 normal and S2 normal.  Pulmonary:     Effort: Pulmonary effort is normal. No respiratory distress.     Breath sounds: Normal breath sounds and air entry.  Musculoskeletal:     Cervical back: Neck supple.  Lymphadenopathy:     Cervical: No cervical adenopathy.  Skin:    General: Skin is warm and dry.     Capillary Refill: Capillary refill takes less than 2 seconds.     Findings: No rash.  Neurological:     General: No focal deficit present.     Mental Status: She is alert and oriented to person, place, and time. Mental status is at baseline.     Cranial Nerves: No dysarthria or facial asymmetry.  Psychiatric:        Mood and Affect: Mood normal.         Speech: Speech normal.        Behavior: Behavior normal.        Thought Content: Thought  content normal.        Judgment: Judgment normal.      UC Treatments / Results  Labs (all labs ordered are listed, but only abnormal results are displayed) Labs Reviewed  POCT INFLUENZA A/B - Abnormal; Notable for the following components:      Result Value   Influenza A, POC Positive (*)    All other components within normal limits  POCT URINALYSIS DIP (MANUAL ENTRY) - Abnormal; Notable for the following components:   Ketones, POC UA small (15) (*)    Spec Grav, UA >=1.030 (*)    Blood, UA trace-intact (*)    Protein Ur, POC =30 (*)    Leukocytes, UA Small (1+) (*)    All other components within normal limits  URINE CULTURE  CERVICOVAGINAL ANCILLARY ONLY    EKG   Radiology No results found.  Procedures Procedures (including critical care time)  Medications Ordered in UC Medications - No data to display  Initial Impression / Assessment and Plan / UC Course  I have reviewed the triage vital signs and the nursing notes.  Pertinent labs & imaging results that were available during my care of the patient were reviewed by me and considered in my medical decision making (see chart for details).   1. Influenza A Flu A point of care testing positive. Lungs clear, vitals hemodynamically stable, therefore deferred imaging. Offered antiviral given timing of illness, Tamiflu sent to pharmacy.  Recommend supportive care for further symptomatic relief as outlined in AVS.  Modes of transmission, quarantine recommendations, and hand hygiene discussed.   2. Dysuria, vaginal discharge Cytology swab pending to evaluate for BV/yeast.  Urinalysis shows signs of possible UTI, though hematuria may be secondary to residual menstrual blood and leukocytes may be due to BV.  Urine culture pending. Will treat for uncomplicated UTI based on urine culture results if clinically indicated. Increased  water/fluid intake encouraged.  Counseled patient on potential for adverse effects with medications prescribed/recommended today, strict ER and return-to-clinic precautions discussed, patient verbalized understanding.    Final Clinical Impressions(s) / UC Diagnoses   Final diagnoses:  Dysuria  Vaginal discharge  Influenza A     Discharge Instructions      You have the flu. Take Tamiflu every 12 hours for the next 5 days to improve symptoms and stop the virus from replicating in your body. Ibuprofen/tylenol as needed for fevers and body aches. Mucinex as needed for nasal congestion. Promethazine DM at bedtime as needed for cough (drowsy).  Vaginal swab is pending and will come back in the next 2-3 days. Staff will call if vaginal swab is positive for infections and treat accordingly per protocol.  Urine culture is pending and staff will call if urine culture grows bacteria/treat for UTI at that time based on protocol. Increase water intake to stay well hydrated.  If you develop any new or worsening symptoms or if your symptoms do not start to improve, please return here or follow-up with your primary care provider. If your symptoms are severe, please go to the emergency room.     ED Prescriptions     Medication Sig Dispense Auth. Provider   oseltamivir (TAMIFLU) 75 MG capsule Take 1 capsule (75 mg total) by mouth every 12 (twelve) hours. 10 capsule Carlisle Beers, FNP   promethazine-dextromethorphan (PROMETHAZINE-DM) 6.25-15 MG/5ML syrup Take 5 mLs by mouth at bedtime as needed for cough. 118 mL Carlisle Beers, FNP      PDMP not reviewed this  encounter.   Carlisle Beers, [place] 04/08/23 1110

## 2023-04-08 NOTE — Discharge Instructions (Signed)
You have the flu. Take Tamiflu every 12 hours for the next 5 days to improve symptoms and stop the virus from replicating in your body. Ibuprofen/tylenol as needed for fevers and body aches. Mucinex as needed for nasal congestion. Promethazine DM at bedtime as needed for cough (drowsy).  Vaginal swab is pending and will come back in the next 2-3 days. Staff will call if vaginal swab is positive for infections and treat accordingly per protocol.  Urine culture is pending and staff will call if urine culture grows bacteria/treat for UTI at that time based on protocol. Increase water intake to stay well hydrated.  If you develop any new or worsening symptoms or if your symptoms do not start to improve, please return here or follow-up with your primary care provider. If your symptoms are severe, please go to the emergency room.

## 2023-04-08 NOTE — ED Triage Notes (Signed)
Pt states cough,fever ,body aches and chills for the past 2 days.  States she has been taking ibuprofen at home.

## 2023-04-09 LAB — URINE CULTURE: Culture: NO GROWTH

## 2023-04-10 ENCOUNTER — Encounter: Payer: Self-pay | Admitting: Internal Medicine

## 2023-04-10 ENCOUNTER — Telehealth (HOSPITAL_BASED_OUTPATIENT_CLINIC_OR_DEPARTMENT_OTHER): Payer: Self-pay

## 2023-04-10 LAB — CERVICOVAGINAL ANCILLARY ONLY
Bacterial Vaginitis (gardnerella): NEGATIVE
Candida Glabrata: NEGATIVE
Candida Vaginitis: POSITIVE — AB
Comment: NEGATIVE
Comment: NEGATIVE
Comment: NEGATIVE

## 2023-04-10 MED ORDER — FLUCONAZOLE 150 MG PO TABS: 150.0000 mg | ORAL_TABLET | Freq: Once | ORAL | 0 refills | Status: AC

## 2023-04-10 NOTE — Telephone Encounter (Signed)
Per protocol, pt requires tx with Diflucan.  Rx sent to pharmacy on file.

## 2023-06-18 DIAGNOSIS — Z01419 Encounter for gynecological examination (general) (routine) without abnormal findings: Secondary | ICD-10-CM | POA: Diagnosis not present

## 2023-06-18 DIAGNOSIS — N760 Acute vaginitis: Secondary | ICD-10-CM | POA: Diagnosis not present

## 2023-06-18 DIAGNOSIS — Z6832 Body mass index (BMI) 32.0-32.9, adult: Secondary | ICD-10-CM | POA: Diagnosis not present

## 2023-06-24 DIAGNOSIS — Z111 Encounter for screening for respiratory tuberculosis: Secondary | ICD-10-CM | POA: Diagnosis not present

## 2023-09-15 ENCOUNTER — Emergency Department (HOSPITAL_COMMUNITY)

## 2023-09-15 ENCOUNTER — Emergency Department (HOSPITAL_COMMUNITY)
Admission: EM | Admit: 2023-09-15 | Discharge: 2023-09-15 | Disposition: A | Discharge status: Home / Self Care | Source: Ambulatory Visit

## 2023-09-15 ENCOUNTER — Ambulatory Visit
Admission: EM | Admit: 2023-09-15 | Discharge: 2023-09-15 | Disposition: A | Discharge status: Home / Self Care | Attending: Internal Medicine | Admitting: Internal Medicine

## 2023-09-15 ENCOUNTER — Encounter: Payer: Self-pay | Admitting: Emergency Medicine

## 2023-09-15 DIAGNOSIS — R1031 Right lower quadrant pain: Secondary | ICD-10-CM

## 2023-09-15 DIAGNOSIS — Z3202 Encounter for pregnancy test, result negative: Secondary | ICD-10-CM | POA: Diagnosis not present

## 2023-09-15 DIAGNOSIS — E876 Hypokalemia: Secondary | ICD-10-CM | POA: Insufficient documentation

## 2023-09-15 DIAGNOSIS — R509 Fever, unspecified: Secondary | ICD-10-CM | POA: Diagnosis not present

## 2023-09-15 LAB — URINALYSIS, ROUTINE W REFLEX MICROSCOPIC
Bacteria, UA: NONE SEEN
Bilirubin Urine: NEGATIVE
Glucose, UA: NEGATIVE mg/dL
Ketones, ur: NEGATIVE mg/dL
Leukocytes,Ua: NEGATIVE
Nitrite: NEGATIVE
Protein, ur: 30 mg/dL — AB
Specific Gravity, Urine: 1.035 — ABNORMAL HIGH (ref 1.005–1.030)
pH: 5 (ref 5.0–8.0)

## 2023-09-15 LAB — WET PREP, GENITAL
Clue Cells Wet Prep HPF POC: NONE SEEN
Sperm: NONE SEEN
Trich, Wet Prep: NONE SEEN
WBC, Wet Prep HPF POC: <10 (ref <10)
Yeast Wet Prep HPF POC: NONE SEEN

## 2023-09-15 LAB — CBC WITH DIFFERENTIAL/PLATELET
Abs Immature Granulocytes: 0.03 10*3/uL (ref 0.00–0.07)
Basophils Absolute: 0 10*3/uL (ref 0.0–0.1)
Basophils Relative: 0 %
Eosinophils Absolute: 0 10*3/uL (ref 0.0–0.5)
Eosinophils Relative: 0 %
HCT: 38.8 % (ref 36.0–46.0)
Hemoglobin: 12.7 g/dL (ref 12.0–15.0)
Immature Granulocytes: 0 %
Lymphocytes Relative: 17 %
Lymphs Abs: 1.2 10*3/uL (ref 0.7–4.0)
MCH: 27.7 pg (ref 26.0–34.0)
MCHC: 32.7 g/dL (ref 30.0–36.0)
MCV: 84.5 fL (ref 80.0–100.0)
Monocytes Absolute: 0.4 10*3/uL (ref 0.1–1.0)
Monocytes Relative: 6 %
Neutro Abs: 5.5 10*3/uL (ref 1.7–7.7)
Neutrophils Relative %: 77 %
Platelets: 326 10*3/uL (ref 150–400)
RBC: 4.59 MIL/uL (ref 3.87–5.11)
RDW: 13.9 % (ref 11.5–15.5)
WBC: 7.2 10*3/uL (ref 4.0–10.5)
nRBC: 0 % (ref 0.0–0.2)

## 2023-09-15 LAB — COMPREHENSIVE METABOLIC PANEL WITH GFR
ALT: 17 U/L (ref 0–44)
AST: 17 U/L (ref 15–41)
Albumin: 3.2 g/dL — ABNORMAL LOW (ref 3.5–5.0)
Alkaline Phosphatase: 32 U/L — ABNORMAL LOW (ref 38–126)
Anion gap: 9 (ref 5–15)
BUN: 11 mg/dL (ref 6–20)
CO2: 21 mmol/L — ABNORMAL LOW (ref 22–32)
Calcium: 8.8 mg/dL — ABNORMAL LOW (ref 8.9–10.3)
Chloride: 105 mmol/L (ref 98–111)
Creatinine, Ser: 0.68 mg/dL (ref 0.44–1.00)
GFR, Estimated: >60 mL/min (ref >60)
Glucose, Bld: 92 mg/dL (ref 70–99)
Potassium: 3.1 mmol/L — ABNORMAL LOW (ref 3.5–5.1)
Sodium: 135 mmol/L (ref 135–145)
Total Bilirubin: 0.8 mg/dL (ref 0.0–1.2)
Total Protein: 6.7 g/dL (ref 6.5–8.1)

## 2023-09-15 LAB — POCT URINE PREGNANCY: Preg Test, Ur: NEGATIVE

## 2023-09-15 LAB — LIPASE, BLOOD: Lipase: 24 U/L (ref 11–51)

## 2023-09-15 LAB — HIV ANTIBODY (ROUTINE TESTING W REFLEX): HIV Screen 4th Generation wRfx: NONREACTIVE

## 2023-09-15 LAB — HCG, SERUM, QUALITATIVE: Preg, Serum: NEGATIVE

## 2023-09-15 MED ORDER — IOHEXOL 300 MG/ML  SOLN
100.0000 mL | Freq: Once | INTRAMUSCULAR | Status: AC | PRN
  Administered 2023-09-15: 100 mL via INTRAVENOUS

## 2023-09-15 MED ORDER — ONDANSETRON HCL 4 MG/2ML IJ SOLN
4.0000 mg | Freq: Once | INTRAMUSCULAR | Status: AC
Start: 2023-09-15 — End: 2023-09-15
  Administered 2023-09-15: 4 mg via INTRAVENOUS
  Filled 2023-09-15: qty 2

## 2023-09-15 MED ORDER — POTASSIUM CHLORIDE CRYS ER 20 MEQ PO TBCR
40.0000 meq | EXTENDED_RELEASE_TABLET | Freq: Once | ORAL | Status: AC
  Administered 2023-09-15: 40 meq via ORAL
  Filled 2023-09-15: qty 2

## 2023-09-15 MED ORDER — SODIUM CHLORIDE (PF) 0.9 % IJ SOLN
INTRAMUSCULAR | Status: AC
  Filled 2023-09-15: qty 50

## 2023-09-15 MED ORDER — ACETAMINOPHEN 500 MG PO TABS
1000.0000 mg | ORAL_TABLET | Freq: Once | ORAL | Status: AC
  Administered 2023-09-15: 1000 mg via ORAL
  Filled 2023-09-15: qty 2

## 2023-09-15 MED ORDER — MORPHINE SULFATE (PF) 4 MG/ML IV SOLN
4.0000 mg | Freq: Once | INTRAVENOUS | Status: AC
  Administered 2023-09-15: 4 mg via INTRAVENOUS
  Filled 2023-09-15: qty 1

## 2023-09-15 NOTE — ED Triage Notes (Signed)
 Pt c/o fever, chills, abdominal pain, and diarrhea that began last night. Denies nausea or vomiting.   She took ibuprofen  around 1pm today

## 2023-09-15 NOTE — ED Notes (Signed)
 Pt CT

## 2023-09-15 NOTE — ED Provider Notes (Signed)
 [place] EMERGENCY DEPARTMENT AT [facility] Provider Note   CSN: [id number] Arrival date & time: 09/15/23  1508     Patient presents with: Abdominal Pain, Nausea, and Fever   Karen Farmer is a 28 y.o. female.   The history is provided by the patient and medical records. No language interpreter was used.  Abdominal Pain Associated symptoms: fever   Fever    28 year old female history of depression, sent here from urgent care center for evaluation of abdominal pain.  Patient complaining of pain to the right side of her abdomen that started since last night.  Pain is sharp throbbing aching worse with movement.  Now she is feeling nauseous and having some fever.  She is currently started her menstruation.  She went to urgent care center for her complaint and sent here for further assessment.  She does endorse decrease in appetite.  She denies any chest pain or shortness of breath no dysuria no vaginal discharge and no new sexual partner.  Remote history of STI.  Prior to Admission medications   Medication Sig Start Date End Date Taking? Authorizing Provider  azithromycin (ZITHROMAX) 250 MG tablet 2 tablet  on the first day, then 1 tablet daily for 4 days Patient not taking: Reported on 09/15/2023 02/10/18   [provider]  cyclobenzaprine  (FLEXERIL ) 5 MG tablet Take 1-2 tablets (5-10 mg total) by mouth 2 (two) times daily as needed for muscle spasms. Patient not taking: Reported on 11/09/2021 05/14/20   Wieters, Hallie C, PA-C  fluconazole  (DIFLUCAN ) 150 MG tablet Take 1 tablet (150 mg total) by mouth every other day. Patient not taking: Reported on 11/09/2021 09/19/20   Stuart Vernell Norris, PA-C  ibuprofen  (ADVIL ) 800 MG tablet Take 1 tablet (800 mg total) by mouth 3 (three) times daily. 05/14/20   Wieters, Hallie C, PA-C  OFLOXACIN OT Place in ear(s).    [provider]  oseltamivir  (TAMIFLU ) 75 MG capsule Take 1 capsule (75 mg total) by mouth every 12  (twelve) hours. Patient not taking: Reported on 09/15/2023 04/08/23   Enedelia Dorna CHRISTELLA, FNP  promethazine -dextromethorphan (PROMETHAZINE -DM) 6.25-15 MG/5ML syrup Take 5 mLs by mouth at bedtime as needed for cough. Patient not taking: Reported on 09/15/2023 04/08/23   Enedelia Dorna CHRISTELLA, FNP    Allergies: Penicillins    Review of Systems  Constitutional:  Positive for fever.  Gastrointestinal:  Positive for abdominal pain.  All other systems reviewed and are negative.   Updated Vital Signs BP (!) 153/79 (BP Location: Left Arm)   Pulse (!) 102   Temp 100.1 F (37.8 C) (Oral)   Resp 16   LMP 07/16/2023 (Approximate)   SpO2 100%   Physical Exam Vitals and nursing note reviewed.  Constitutional:      General: She is not in acute distress.    Appearance: She is well-developed. She is obese.  HENT:     Head: Atraumatic.   Eyes:     Conjunctiva/sclera: Conjunctivae normal.    Cardiovascular:     Rate and Rhythm: Normal rate and regular rhythm.  Pulmonary:     Effort: Pulmonary effort is normal.  Abdominal:     General: Bowel sounds are normal.     Tenderness: There is abdominal tenderness in the right lower quadrant and suprapubic area. There is guarding. There is no rebound. Positive signs include McBurney's sign. Negative signs include Murphy's sign and Rovsing's sign.   Musculoskeletal:     Cervical back: Neck supple.  Skin:    Findings: No rash.   Neurological:     Mental Status: She is alert.   Psychiatric:        Mood and Affect: Mood normal.     (all labs ordered are listed, but only abnormal results are displayed) Labs Reviewed  COMPREHENSIVE METABOLIC PANEL WITH GFR - Abnormal; Notable for the following components:      Result Value   Potassium 3.1 (*)    CO2 21 (*)    Calcium 8.8 (*)    Albumin 3.2 (*)    Alkaline Phosphatase 32 (*)    All other components within normal limits  URINALYSIS, ROUTINE W REFLEX MICROSCOPIC - Abnormal; Notable for the  following components:   APPearance HAZY (*)    Specific Gravity, Urine 1.035 (*)    Hgb urine dipstick MODERATE (*)    Protein, ur 30 (*)    All other components within normal limits  WET PREP, GENITAL  CBC WITH DIFFERENTIAL/PLATELET  LIPASE, BLOOD  HCG, SERUM, QUALITATIVE  HIV ANTIBODY (ROUTINE TESTING W REFLEX)  RPR  GC/CHLAMYDIA PROBE AMP ([place]) NOT AT [facility]    EKG: None  Radiology: CT ABDOMEN PELVIS W CONTRAST Result Date: 09/15/2023 CLINICAL DATA:  Right lower quadrant pain EXAM: CT ABDOMEN AND PELVIS WITH CONTRAST TECHNIQUE: Multidetector CT imaging of the abdomen and pelvis was performed using the standard protocol following bolus administration of intravenous contrast. RADIATION DOSE REDUCTION: This exam was performed according to the departmental dose-optimization program which includes automated exposure control, adjustment of the mA and/or kV according to patient size and/or use of iterative reconstruction technique. CONTRAST:  100mL OMNIPAQUE IOHEXOL 300 MG/ML  SOLN COMPARISON:  None Available. FINDINGS: Lower chest: No acute abnormality. Hepatobiliary: No focal liver abnormality is seen. No gallstones, gallbladder wall thickening, or biliary dilatation. Pancreas: Unremarkable. No pancreatic ductal dilatation or surrounding inflammatory changes. Spleen: Normal in size without focal abnormality. Adrenals/Urinary Tract: Adrenal glands are unremarkable. Kidneys are normal, without renal calculi, focal lesion, or hydronephrosis. Bladder is unremarkable. Stomach/Bowel: Stomach is within normal limits. Appendix not well seen but no right lower quadrant inflammatory process. Fluid in the colon likely corresponds to history of diarrhea. No acute bowel wall thickening Vascular/Lymphatic: No significant vascular findings are present. No enlarged abdominal or pelvic lymph nodes. Reproductive: Uterus and bilateral adnexa are unremarkable. Other: No abdominal wall hernia or abnormality. No  abdominopelvic ascites. Musculoskeletal: No acute or significant osseous findings. IMPRESSION: 1. No CT evidence for acute intra-abdominal or pelvic abnormality. Appendix not well seen but no right lower quadrant inflammatory process. 2. Fluid in the colon likely corresponds to history of diarrhea. Electronically Signed   By: Luke Bun M.D.   On: 09/15/2023 17:40     .Pelvic exam  Date/Time: 09/15/2023 5:07 PM  Performed by: Nivia Colon, PA-C Authorized by: Nivia Colon, PA-C  Comments: Pelvic exam performed with permission of pt and female ED RN assist during exam.  External genitalia w/out lesions.  Vaginal vault with small amount of blood noted.  Cervix w/out lesions, not friable, GC/Chlamydia and wet prep obtained and sent to lab.  Bimanual exam w/out CMT, uterine or adnexal tenderness       Medications Ordered in the ED  morphine (PF) 4 MG/ML injection 4 mg (4 mg Intravenous Given 09/15/23 1752)  acetaminophen  (TYLENOL ) tablet 1,000 mg (1,000 mg Oral Given 09/15/23 1751)  ondansetron  (ZOFRAN ) injection 4 mg (4 mg Intravenous Given 09/15/23 1753)  iohexol (OMNIPAQUE) 300 MG/ML solution 100 mL (100  mLs Intravenous Contrast Given 09/15/23 1723)                                    Medical Decision Making Amount and/or Complexity of Data Reviewed Labs: ordered. Radiology: ordered.  Risk OTC drugs. Prescription drug management.   BP (!) 153/79 (BP Location: Left Arm)   Pulse (!) 102   Temp 100.1 F (37.8 C) (Oral)   Resp 16   LMP 07/16/2023 (Approximate)   SpO2 100%   63:64 PM  28 year old female history of depression, sent here from urgent care center for evaluation of abdominal pain.  Patient complaining of pain to the right side of her abdomen that started since last night.  Pain is sharp throbbing aching worse with movement.  Now she is feeling nauseous and having some fever.  She is currently started her menstruation.  She went to urgent care center for her complaint and sent  here for further assessment.  She does endorse decrease in appetite.  She denies any chest pain or shortness of breath no dysuria no vaginal discharge and no new sexual partner.  Remote history of STI.  On exam patient does have tenderness to her right lower quadrant on palpation with some guarding but no rebound tenderness.  Pelvic exam without signs concerning for PID or ovarian torsion.  Workup initiated, will obtain abdominal pelvis CT scan for further assessment.  -Labs ordered, independently viewed and interpreted by me.  Labs remarkable for normal WBC, normal wet prep, slightly low potassium of 3.1 supplementation given, normal lipase, pregnancy test is negative, urinalysis without signs of UTI -The patient was maintained on a cardiac monitor.  I personally viewed and interpreted the cardiac monitored which showed an underlying rhythm of: NSR -Imaging independently viewed and interpreted by me and I agree with radiologist's interpretation.  Result remarkable for abd/pelvis CT showing no acute finding.  Appendix not well visualized but no obvious inflammatory process -This patient presents to the ED for concern of abd pain, this involves an extensive number of treatment options, and is a complaint that carries with it a high risk of complications and morbidity.  The differential diagnosis includes appendicitis, colitis, diverticulitis, pancreatitis, cholecystitis, UTI, ovarian torsion, TOA, ectopic pregnancy, PID -Co morbidities that complicate the patient evaluation includes depression -Treatment includes morphine, zofran  -Reevaluation of the patient after these medicines showed that the patient improved -PCP office notes or outside notes reviewed -Escalation to admission/observation considered: patients feels much better, is comfortable with discharge, and will follow up with PCP -Prescription medication considered, patient comfortable with OTC meds such as tylenol /ibuprofen  -Social Determinant  of Health considered   No obvious concerning finding were noted on today's exam, this could be early appendicitis.  I encouraged patient to return promptly if her condition progressed.  Patient was understanding and agrees with plan.      Final diagnoses:  RLQ abdominal pain  Hypokalemia    ED Discharge Orders     None          Nivia Colon, PA-C 09/15/23 1941    Simon Lavonia SAILOR, MD 09/16/23 [phone]

## 2023-09-15 NOTE — ED Notes (Signed)
 Patient is being discharged from the Urgent Care and sent to the Emergency Department via POV . Per Rosaline Silk, FNP, patient is in need of higher level of care due to abdominal pain. Patient is aware and verbalizes understanding of plan of care.  Vitals:   09/15/23 1420 09/15/23 1421  BP:  119/75  Pulse: 100   Resp: 17   Temp:  99.2 F (37.3 C)  SpO2: 96%

## 2023-09-15 NOTE — ED Provider Notes (Addendum)
 [facility]    CSN: [id number] Arrival date & time: 09/15/23  1412      History   Chief Complaint Chief Complaint  Patient presents with   Fever   Abdominal Pain    HPI Karen Farmer is a 28 y.o. female.   Karen Farmer is a 28 y.o. female presenting for chief complaint of Fever and Abdominal Pain that started 2 to 3 days ago.  She has also had a few episodes of nonbloody diarrhea over the last few days.  Her abdominal pain has worsened significantly in the last 24 hours and has become more constant.  Temperature was 100.9 axillary this morning after taking ibuprofen .  Pain is localized mostly to the right lower quadrant abdomen just shy of the umbilicus and does not travel to other areas of the abdomen or through the body to the back.  She denies nausea, vomiting, urinary symptoms, vaginal symptoms, flank pain, gross hematuria, dizziness, viral URI symptoms, recent antibiotic or steroid use.  Denies recent sick contacts with similar symptoms.  No previous surgeries to the abdomen reported.  Last menstrual cycle was Jul 16, 2023.  She is taking ibuprofen  with  minimal relief of abdominal pain.    Fever Abdominal Pain Associated symptoms: fever     Past Medical History:  Diagnosis Date   Depression     Patient Active Problem List   Diagnosis Date Noted   Depression     Past Surgical History:  Procedure Laterality Date   CARDIAC SURGERY     Age 9 years, stent placed   WISDOM TOOTH EXTRACTION      OB History   No obstetric history on file.      Home Medications    Prior to Admission medications   Medication Sig Start Date End Date Taking? Authorizing Provider  azithromycin (ZITHROMAX) 250 MG tablet 2 tablet  on the first day, then 1 tablet daily for 4 days Patient not taking: Reported on 09/15/2023 02/10/18   [provider]  cyclobenzaprine  (FLEXERIL ) 5 MG tablet Take 1-2 tablets (5-10 mg total) by mouth 2 (two) times daily as needed for  muscle spasms. Patient not taking: Reported on 11/09/2021 05/14/20   Wieters, Hallie C, PA-C  fluconazole  (DIFLUCAN ) 150 MG tablet Take 1 tablet (150 mg total) by mouth every other day. Patient not taking: Reported on 11/09/2021 09/19/20   Stuart Vernell Norris, PA-C  ibuprofen  (ADVIL ) 800 MG tablet Take 1 tablet (800 mg total) by mouth 3 (three) times daily. 05/14/20   Wieters, Hallie C, PA-C  OFLOXACIN OT Place in ear(s).    [provider]  oseltamivir  (TAMIFLU ) 75 MG capsule Take 1 capsule (75 mg total) by mouth every 12 (twelve) hours. Patient not taking: Reported on 09/15/2023 04/08/23   Enedelia Dorna CHRISTELLA, FNP  promethazine -dextromethorphan (PROMETHAZINE -DM) 6.25-15 MG/5ML syrup Take 5 mLs by mouth at bedtime as needed for cough. Patient not taking: Reported on 09/15/2023 04/08/23   Enedelia Dorna CHRISTELLA, FNP    Family History Family History  Problem Relation Age of Onset   Kidney disease Mother    Hypertension Mother    Hyperlipidemia Mother    Asthma Mother    Diabetes Mother    Diabetes Father     Social History Social History   Tobacco Use   Smoking status: Never   Smokeless tobacco: Never  Vaping Use   Vaping status: Never Used  Substance Use Topics   Alcohol use: No   Drug use:  No     Allergies   Penicillins   Review of Systems Review of Systems  Constitutional:  Positive for fever.  Gastrointestinal:  Positive for abdominal pain.  Per HPI   Physical Exam Triage Vital Signs ED Triage Vitals  Encounter Vitals Group     BP 09/15/23 1421 119/75     Girls Systolic BP Percentile --      Girls Diastolic BP Percentile --      Boys Systolic BP Percentile --      Boys Diastolic BP Percentile --      Pulse Rate 09/15/23 1420 100     Resp 09/15/23 1420 17     Temp 09/15/23 1421 99.2 F (37.3 C)     Temp Source 09/15/23 1420 Oral     SpO2 09/15/23 1420 96 %     Weight --      Height --      Head Circumference --      Peak Flow --      Pain Score  09/15/23 1421 9     Pain Loc --      Pain Education --      Exclude from Growth Chart --    No data found.  Updated Vital Signs BP 119/75 (BP Location: Right Arm)   Pulse 100   Temp 99.2 F (37.3 C) (Oral)   Resp 17   LMP 07/16/2023 (Approximate)   SpO2 96%   Visual Acuity Right Eye Distance:   Left Eye Distance:   Bilateral Distance:    Right Eye Near:   Left Eye Near:    Bilateral Near:     Physical Exam Vitals and nursing note reviewed.  Constitutional:      Appearance: She is not ill-appearing or toxic-appearing.  HENT:     Head: Normocephalic and atraumatic.     Right Ear: Hearing and external ear normal.     Left Ear: Hearing and external ear normal.     Nose: Nose normal.     Mouth/Throat:     Lips: Pink.   Eyes:     General: Lids are normal. Vision grossly intact. Gaze aligned appropriately.     Extraocular Movements: Extraocular movements intact.     Conjunctiva/sclera: Conjunctivae normal.   Pulmonary:     Effort: Pulmonary effort is normal.  Abdominal:     General: Abdomen is flat. Bowel sounds are normal.     Palpations: Abdomen is soft.     Tenderness: There is abdominal tenderness (Positive McBurney's sign with rebound tenderness to the right lower quadrant abdomen.) in the right lower quadrant. There is rebound. There is no right CVA tenderness, left CVA tenderness or guarding. Positive signs include McBurney's sign.   Musculoskeletal:     Cervical back: Neck supple.   Skin:    General: Skin is warm and dry.     Capillary Refill: Capillary refill takes less than 2 seconds.     Findings: No rash.   Neurological:     General: No focal deficit present.     Mental Status: She is alert and oriented to person, place, and time. Mental status is at baseline.     Cranial Nerves: No dysarthria or facial asymmetry.   Psychiatric:        Mood and Affect: Mood normal.        Speech: Speech normal.        Behavior: Behavior normal.        Thought  Content: Thought content  normal.        Judgment: Judgment normal.      UC Treatments / Results  Labs (all labs ordered are listed, but only abnormal results are displayed) Labs Reviewed  POCT URINE PREGNANCY    EKG   Radiology No results found.  Procedures Procedures (including critical care time)  Medications Ordered in UC Medications - No data to display  Initial Impression / Assessment and Plan / UC Course  I have reviewed the triage vital signs and the nursing notes.  Pertinent labs & imaging results that were available during my care of the patient were reviewed by me and considered in my medical decision making (see chart for details).   1.  Right lower quadrant abdominal pain, fever, negative urine pregnancy test McBurney sign is positive on exam with rebound tenderness to the right lower quadrant abdomen with fever and diarrhea at home.  Differential includes acute appendicitis, colitis, ovarian cyst rupture, ovarian torsion, UTI, etc. Emergent pathologies will require further imaging and workup in the emergency department.  Urine pregnancy is negative.  Discussed clinical concerns/exam findings leading to recommendation for further workup in the ER setting and risks of deferring ER visit with patient/family. Patient/family express understanding and agreement with plan, discharged to ER via private car.   Final Clinical Impressions(s) / UC Diagnoses   Final diagnoses:  Abdominal pain, right lower quadrant  Fever, unspecified  Urine pregnancy test negative   Discharge Instructions   None    ED Prescriptions   None    PDMP not reviewed this encounter.   Enedelia Dorna HERO, FNP 09/15/23 1506    Enedelia Dorna HERO, [place] 09/15/23 1507

## 2023-09-15 NOTE — Discharge Instructions (Signed)
 You have been evaluated for your symptoms.  Your CT scan today did not show any signs of infection.  However, if you develop progressive worsening pain, fever, nausea you may need to return to the ER as early appendicitis could cause similar symptoms.  Your potassium level is a bit low today.  Eat a banana daily for the next week and have your potassium rechecked.

## 2023-09-15 NOTE — ED Triage Notes (Signed)
 Pt arrived reporting RLQ, fever and chills since lastnight. States went to [facility] and was referred to ED for evaluation. No other symptoms reported

## 2023-09-16 ENCOUNTER — Encounter (HOSPITAL_COMMUNITY): Payer: Self-pay | Admitting: Emergency Medicine

## 2023-09-16 ENCOUNTER — Emergency Department (HOSPITAL_COMMUNITY)

## 2023-09-16 ENCOUNTER — Emergency Department (HOSPITAL_COMMUNITY)
Admission: EM | Admit: 2023-09-16 | Discharge: 2023-09-16 | Disposition: A | Discharge status: Home / Self Care | Attending: Emergency Medicine | Admitting: Emergency Medicine

## 2023-09-16 DIAGNOSIS — R112 Nausea with vomiting, unspecified: Secondary | ICD-10-CM | POA: Diagnosis not present

## 2023-09-16 DIAGNOSIS — R110 Nausea: Secondary | ICD-10-CM | POA: Diagnosis not present

## 2023-09-16 DIAGNOSIS — R1011 Right upper quadrant pain: Secondary | ICD-10-CM | POA: Insufficient documentation

## 2023-09-16 DIAGNOSIS — R197 Diarrhea, unspecified: Secondary | ICD-10-CM | POA: Diagnosis not present

## 2023-09-16 LAB — CBC
HCT: 39 % (ref 36.0–46.0)
Hemoglobin: 12.8 g/dL (ref 12.0–15.0)
MCH: 28 pg (ref 26.0–34.0)
MCHC: 32.8 g/dL (ref 30.0–36.0)
MCV: 85.3 fL (ref 80.0–100.0)
Platelets: 313 10*3/uL (ref 150–400)
RBC: 4.57 MIL/uL (ref 3.87–5.11)
RDW: 13.9 % (ref 11.5–15.5)
WBC: 6.3 10*3/uL (ref 4.0–10.5)
nRBC: 0 % (ref 0.0–0.2)

## 2023-09-16 LAB — GC/CHLAMYDIA PROBE AMP (~~LOC~~) NOT AT ARMC
Chlamydia: NEGATIVE
Comment: NEGATIVE
Comment: NORMAL
Neisseria Gonorrhea: NEGATIVE

## 2023-09-16 LAB — COMPREHENSIVE METABOLIC PANEL WITH GFR
ALT: 22 U/L (ref 0–44)
AST: 18 U/L (ref 15–41)
Albumin: 3.3 g/dL — ABNORMAL LOW (ref 3.5–5.0)
Alkaline Phosphatase: 32 U/L — ABNORMAL LOW (ref 38–126)
Anion gap: 9 (ref 5–15)
BUN: 13 mg/dL (ref 6–20)
CO2: 19 mmol/L — ABNORMAL LOW (ref 22–32)
Calcium: 8.8 mg/dL — ABNORMAL LOW (ref 8.9–10.3)
Chloride: 110 mmol/L (ref 98–111)
Creatinine, Ser: 0.72 mg/dL (ref 0.44–1.00)
GFR, Estimated: >60 mL/min (ref >60)
Glucose, Bld: 92 mg/dL (ref 70–99)
Potassium: 3.5 mmol/L (ref 3.5–5.1)
Sodium: 138 mmol/L (ref 135–145)
Total Bilirubin: 0.4 mg/dL (ref 0.0–1.2)
Total Protein: 7.1 g/dL (ref 6.5–8.1)

## 2023-09-16 LAB — LIPASE, BLOOD: Lipase: 22 U/L (ref 11–51)

## 2023-09-16 MED ORDER — ONDANSETRON 8 MG PO TBDP: 8.0000 mg | ORAL_TABLET | Freq: Three times a day (TID) | ORAL | 0 refills | Status: AC | PRN

## 2023-09-16 NOTE — ED Provider Notes (Signed)
 [place] EMERGENCY DEPARTMENT AT [facility] Provider Note   CSN: [id number] Arrival date & time: 09/16/23  1115     Patient presents with: Abdominal Pain (RLQ)   Karen Farmer is a 28 y.o. female.   HPI 28 year old female presents today complaining of right-sided pelvic pain.  Patient has had nausea but no active vomiting.  She reports several loose bowel movements with some clear discoloration today.  She was seen in the ED yesterday was evaluated with labs and CT of abdomen.  CT did not visualize appendix but did not see any associated inflammation in the right lower quadrant.  Gallbladder not reveal any gallstones.  She has continued to have pain at 5 out of 10.  She unable to tell me anything that exacerbates it.  She has not wanted to eat and has had decreased p.o. intake since Monday.  Denies any UTI symptoms.  States she had some fever this morning.  No chest pain or shortness of breath.  She reports that she is not pregnant and had a negative pregnancy test done yesterday.    Prior to Admission medications   Medication Sig Start Date End Date Taking? Authorizing Provider  ondansetron  (ZOFRAN -ODT) 8 MG disintegrating tablet Take 1 tablet (8 mg total) by mouth every 8 (eight) hours as needed for nausea or vomiting. 09/16/23  Yes Levander Houston, MD  azithromycin (ZITHROMAX) 250 MG tablet 2 tablet  on the first day, then 1 tablet daily for 4 days Patient not taking: Reported on 09/15/2023 02/10/18   [provider]  cyclobenzaprine  (FLEXERIL ) 5 MG tablet Take 1-2 tablets (5-10 mg total) by mouth 2 (two) times daily as needed for muscle spasms. Patient not taking: Reported on 11/09/2021 05/14/20   Wieters, Hallie C, PA-C  fluconazole  (DIFLUCAN ) 150 MG tablet Take 1 tablet (150 mg total) by mouth every other day. Patient not taking: Reported on 11/09/2021 09/19/20   Stuart Vernell Norris, PA-C  ibuprofen  (ADVIL ) 800 MG tablet Take 1 tablet (800 mg total) by mouth 3  (three) times daily. 05/14/20   Wieters, Hallie C, PA-C  OFLOXACIN OT Place in ear(s).    [provider]  oseltamivir  (TAMIFLU ) 75 MG capsule Take 1 capsule (75 mg total) by mouth every 12 (twelve) hours. Patient not taking: Reported on 09/15/2023 04/08/23   Enedelia Dorna CHRISTELLA, FNP  promethazine -dextromethorphan (PROMETHAZINE -DM) 6.25-15 MG/5ML syrup Take 5 mLs by mouth at bedtime as needed for cough. Patient not taking: Reported on 09/15/2023 04/08/23   Enedelia Dorna CHRISTELLA, FNP    Allergies: Penicillins    Review of Systems  Updated Vital Signs BP 125/75 (BP Location: Right Arm)   Pulse 92   Temp 99.9 F (37.7 C) (Oral)   Resp 16   LMP 07/16/2023 (Approximate)   SpO2 100%   Physical Exam Vitals reviewed.  Constitutional:      Appearance: She is well-developed. She is obese.  HENT:     Head: Normocephalic.     Mouth/Throat:     Mouth: Mucous membranes are moist.  Eyes:     Extraocular Movements: Extraocular movements intact.  Cardiovascular:     Rate and Rhythm: Normal rate.     Heart sounds: Normal heart sounds.  Pulmonary:     Effort: Pulmonary effort is normal.  Abdominal:     General: Abdomen is flat. Bowel sounds are normal.     Palpations: Abdomen is soft. There is no shifting dullness.     Tenderness: There is abdominal tenderness  in the right upper quadrant.  Neurological:     Mental Status: She is alert.     (all labs ordered are listed, but only abnormal results are displayed) Labs Reviewed  COMPREHENSIVE METABOLIC PANEL WITH GFR - Abnormal; Notable for the following components:      Result Value   CO2 19 (*)    Calcium 8.8 (*)    Albumin 3.3 (*)    Alkaline Phosphatase 32 (*)    All other components within normal limits  CBC  LIPASE, BLOOD    EKG: None  Radiology: US  Abdomen Complete Result Date: 09/16/2023 CLINICAL DATA:  righ t side abdominal pain increased in ruq. EXAM: ABDOMEN ULTRASOUND COMPLETE COMPARISON:  CT scan abdomen and  pelvis from 09/15/2023. FINDINGS: Gallbladder: No gallstones or wall thickening visualized. No sonographic Murphy sign noted by sonographer. Common bile duct: Diameter: Up to 2.3 mm. No intrahepatic bile duct dilation. Liver: No focal lesion identified. Within normal limits in parenchymal echogenicity. Portal vein is patent on color Doppler imaging with normal direction of blood flow towards the liver. IVC: No abnormality visualized. Pancreas: Visualized portion unremarkable. Spleen: Size and appearance within normal limits. Right Kidney: Length: 11.4 cm. Echogenicity within normal limits. No mass or hydronephrosis visualized. Left Kidney: Length: 11.7 cm. Echogenicity within normal limits. No mass or hydronephrosis visualized. Abdominal aorta: No aneurysm visualized. Other findings: None. IMPRESSION: *Unremarkable abdominal sonogram. Electronically Signed   By: Ree Molt M.D.   On: 09/16/2023 13:14   CT ABDOMEN PELVIS W CONTRAST Result Date: 09/15/2023 CLINICAL DATA:  Right lower quadrant pain EXAM: CT ABDOMEN AND PELVIS WITH CONTRAST TECHNIQUE: Multidetector CT imaging of the abdomen and pelvis was performed using the standard protocol following bolus administration of intravenous contrast. RADIATION DOSE REDUCTION: This exam was performed according to the departmental dose-optimization program which includes automated exposure control, adjustment of the mA and/or kV according to patient size and/or use of iterative reconstruction technique. CONTRAST:  100mL OMNIPAQUE IOHEXOL 300 MG/ML  SOLN COMPARISON:  None Available. FINDINGS: Lower chest: No acute abnormality. Hepatobiliary: No focal liver abnormality is seen. No gallstones, gallbladder wall thickening, or biliary dilatation. Pancreas: Unremarkable. No pancreatic ductal dilatation or surrounding inflammatory changes. Spleen: Normal in size without focal abnormality. Adrenals/Urinary Tract: Adrenal glands are unremarkable. Kidneys are normal, without  renal calculi, focal lesion, or hydronephrosis. Bladder is unremarkable. Stomach/Bowel: Stomach is within normal limits. Appendix not well seen but no right lower quadrant inflammatory process. Fluid in the colon likely corresponds to history of diarrhea. No acute bowel wall thickening Vascular/Lymphatic: No significant vascular findings are present. No enlarged abdominal or pelvic lymph nodes. Reproductive: Uterus and bilateral adnexa are unremarkable. Other: No abdominal wall hernia or abnormality. No abdominopelvic ascites. Musculoskeletal: No acute or significant osseous findings. IMPRESSION: 1. No CT evidence for acute intra-abdominal or pelvic abnormality. Appendix not well seen but no right lower quadrant inflammatory process. 2. Fluid in the colon likely corresponds to history of diarrhea. Electronically Signed   By: Luke Bun M.D.   On: 09/15/2023 17:40     Procedures   Medications Ordered in the ED - No data to display  Clinical Course as of 09/16/23 1401  Wed Sep 16, 2023  1338 Ultrasound of abdomen is unremarkable [DR]  1339 CBC was reviewed and interpreted and within normal limits [DR]  1339 Complete metabolic panel reviewed interpreted significant for mild hypocalcemia 8.8 and CO2 19, less than normal limits [DR]    Clinical Course User Index [DR] Levander Houston,  MD                                 Medical Decision Making Amount and/or Complexity of Data Reviewed Labs: ordered. Radiology: ordered.   Luisa is a 28 year old female who is complaining of right sided abdominal pain.  She was seen and evaluated for this yesterday with CT scan and labs.  She continues to have some crampy right-sided pain with nausea and diarrhea.  Her tenderness seems more in the right upper quadrant today.  Suspect this ultrasound was obtained.  Repeat labs were obtained.  She continues have a normal white blood cell count.  Right upper quadrant scan does not show any evidence of gallstones or  cholecystitis.  She is not pregnant from yesterday's labs.  Urine does not show any evidence of acute infection.  Will give prescription for Zofran .  Patient is advised of return precautions and need for follow-up and voices understanding.     Final diagnoses:  Right upper quadrant abdominal pain  Diarrhea, unspecified type  Nausea    ED Discharge Orders          Ordered    ondansetron  (ZOFRAN -ODT) 8 MG disintegrating tablet  Every 8 hours PRN        09/16/23 1401               Levander Houston, MD 09/16/23 1401

## 2023-09-16 NOTE — ED Triage Notes (Signed)
 Pt arriving POV for RLQ abd pain and fever. Pt was seen yesterday for same. Pt reports today she is more concerned because her bowel movements have changed from brown to a clay color. Pt also reports decreased appetite, saying she has not been able to really eat or drink anything for the past couple days.

## 2023-09-23 DIAGNOSIS — F320 Major depressive disorder, single episode, mild: Secondary | ICD-10-CM | POA: Diagnosis not present

## 2023-10-06 DIAGNOSIS — F320 Major depressive disorder, single episode, mild: Secondary | ICD-10-CM | POA: Diagnosis not present

## 2023-11-05 DIAGNOSIS — F320 Major depressive disorder, single episode, mild: Secondary | ICD-10-CM | POA: Diagnosis not present

## 2023-11-17 DIAGNOSIS — F320 Major depressive disorder, single episode, mild: Secondary | ICD-10-CM | POA: Diagnosis not present

## 2023-11-24 DIAGNOSIS — F320 Major depressive disorder, single episode, mild: Secondary | ICD-10-CM | POA: Diagnosis not present

## 2023-12-03 DIAGNOSIS — F320 Major depressive disorder, single episode, mild: Secondary | ICD-10-CM | POA: Diagnosis not present

## 2023-12-17 DIAGNOSIS — F320 Major depressive disorder, single episode, mild: Secondary | ICD-10-CM | POA: Diagnosis not present

## 2024-01-04 DIAGNOSIS — F320 Major depressive disorder, single episode, mild: Secondary | ICD-10-CM | POA: Diagnosis not present

## 2024-01-11 DIAGNOSIS — F320 Major depressive disorder, single episode, mild: Secondary | ICD-10-CM | POA: Diagnosis not present

## 2024-01-18 DIAGNOSIS — F320 Major depressive disorder, single episode, mild: Secondary | ICD-10-CM | POA: Diagnosis not present

## 2024-01-27 DIAGNOSIS — N760 Acute vaginitis: Secondary | ICD-10-CM | POA: Diagnosis not present

## 2024-01-27 DIAGNOSIS — F320 Major depressive disorder, single episode, mild: Secondary | ICD-10-CM | POA: Diagnosis not present

## 2024-01-27 DIAGNOSIS — B9689 Other specified bacterial agents as the cause of diseases classified elsewhere: Secondary | ICD-10-CM | POA: Diagnosis not present
